# Patient Record
Sex: Female | Born: 1955 | Race: White | Hispanic: No | Marital: Single | Smoking: Never smoker
Health system: Southern US, Community
[De-identification: ages and names within clinical notes are randomized; demographics above are authoritative.]

## PROBLEM LIST (undated history)

## (undated) DIAGNOSIS — Z9889 Other specified postprocedural states: Secondary | ICD-10-CM

## (undated) DIAGNOSIS — E785 Hyperlipidemia, unspecified: Secondary | ICD-10-CM

## (undated) DIAGNOSIS — K219 Gastro-esophageal reflux disease without esophagitis: Secondary | ICD-10-CM

## (undated) DIAGNOSIS — R112 Nausea with vomiting, unspecified: Secondary | ICD-10-CM

## (undated) DIAGNOSIS — M1990 Unspecified osteoarthritis, unspecified site: Secondary | ICD-10-CM

## (undated) HISTORY — PX: OTHER SURGICAL HISTORY: SHX169

## (undated) HISTORY — DX: Gastro-esophageal reflux disease without esophagitis: K21.9

## (undated) HISTORY — DX: Hyperlipidemia, unspecified: E78.5

## (undated) HISTORY — PX: COLONOSCOPY: SHX174

## (undated) HISTORY — PX: KNEE SURGERY: SHX244

---

## 2016-05-16 ENCOUNTER — Encounter: Payer: Self-pay | Admitting: Emergency Medicine

## 2016-05-16 ENCOUNTER — Emergency Department
Admission: EM | Admit: 2016-05-16 | Discharge: 2016-05-16 | Disposition: A | Discharge status: Home / Self Care | Payer: BLUE CROSS/BLUE SHIELD | Attending: Emergency Medicine | Admitting: Emergency Medicine

## 2016-05-16 ENCOUNTER — Emergency Department: Payer: BLUE CROSS/BLUE SHIELD

## 2016-05-16 DIAGNOSIS — R079 Chest pain, unspecified: Secondary | ICD-10-CM

## 2016-05-16 DIAGNOSIS — R0789 Other chest pain: Secondary | ICD-10-CM | POA: Insufficient documentation

## 2016-05-16 LAB — BASIC METABOLIC PANEL
ANION GAP: 7 (ref 5–15)
BUN: 10 mg/dL (ref 6–20)
CALCIUM: 9.5 mg/dL (ref 8.9–10.3)
CO2: 25 mmol/L (ref 22–32)
Chloride: 108 mmol/L (ref 101–111)
Creatinine, Ser: 0.8 mg/dL (ref 0.44–1.00)
GFR calc Af Amer: >60 mL/min (ref >60)
Glucose, Bld: 100 mg/dL — ABNORMAL HIGH (ref 65–99)
POTASSIUM: 3.9 mmol/L (ref 3.5–5.1)
SODIUM: 140 mmol/L (ref 135–145)

## 2016-05-16 LAB — CBC
HEMATOCRIT: 39.3 % (ref 35.0–47.0)
Hemoglobin: 13.2 g/dL (ref 12.0–16.0)
MCH: 28.5 pg (ref 26.0–34.0)
MCHC: 33.5 g/dL (ref 32.0–36.0)
MCV: 85.1 fL (ref 80.0–100.0)
Platelets: 210 10*3/uL (ref 150–440)
RBC: 4.62 MIL/uL (ref 3.80–5.20)
RDW: 13.4 % (ref 11.5–14.5)
WBC: 5.6 10*3/uL (ref 3.6–11.0)

## 2016-05-16 LAB — TROPONIN I

## 2016-05-16 MED ORDER — MECLIZINE HCL 25 MG PO TABS
25.0000 mg | ORAL_TABLET | Freq: Once | ORAL | Status: AC
  Administered 2016-05-16: 25 mg via ORAL
  Filled 2016-05-16: qty 1

## 2016-05-16 MED ORDER — KETOROLAC TROMETHAMINE 30 MG/ML IJ SOLN
15.0000 mg | Freq: Once | INTRAMUSCULAR | Status: AC
  Administered 2016-05-16: 15 mg via INTRAVENOUS
  Filled 2016-05-16: qty 1

## 2016-05-16 NOTE — Discharge Instructions (Signed)
Return to the ER for new or worsening pain, difficulty breathing, fever, or for any other concerns. The sure to follow up with a cardiologist for a heart stress test.  Chest Wall Pain Chest wall pain is pain in or around the bones and muscles of your chest. Sometimes, an injury causes this pain. Sometimes, the cause may not be known. This pain may take several weeks or longer to get better. HOME CARE INSTRUCTIONS  Pay attention to any changes in your symptoms. Take these actions to help with your pain:   Rest as told by your health care provider.   Avoid activities that cause pain. These include any activities that use your chest muscles or your abdominal and side muscles to lift heavy items.   If directed, apply ice to the painful area:  Put ice in a plastic bag.  Place a towel between your skin and the bag.  Leave the ice on for 20 minutes, 2-3 times per day.  Take over-the-counter and prescription medicines only as told by your health care provider.  Do not use tobacco products, including cigarettes, chewing tobacco, and e-cigarettes. If you need help quitting, ask your health care provider.  Keep all follow-up visits as told by your health care provider. This is important. SEEK MEDICAL CARE IF:  You have a fever.  Your chest pain becomes worse.  You have new symptoms. SEEK IMMEDIATE MEDICAL CARE IF:  You have nausea or vomiting.  You feel sweaty or light-headed.  You have a cough with phlegm (sputum) or you cough up blood.  You develop shortness of breath.   This information is not intended to replace advice given to you by your health care provider. Make sure you discuss any questions you have with your health care provider.   Document Released: 12/09/2005 Document Revised: 08/30/2015 Document Reviewed: 03/06/2015 Elsevier Interactive Patient Education [employer]2016 [employer]

## 2016-05-16 NOTE — ED Notes (Signed)
Pt presents to ED with new onset chest pain since last night. Pt describes pain as an aching pressure. Pt states pain is central chest and back. Pt denies radiation to neck or arm. Pt speaking in complete sentences, denies shortness of breath. Pt reports mild nausea and vomiting.

## 2016-05-16 NOTE — ED Notes (Signed)

## 2016-05-16 NOTE — ED Provider Notes (Signed)
[facility] Emergency Department Provider Note ____________________________________________  Time seen: Approximately 7:16 PM  I have reviewed the triage vital signs and the nursing notes.   HISTORY  Chief Complaint Chest Pain   HPI Samantha Gray is a 60 y.o. female with no significant past medical history aside from arthritis and allergies who presents with central substernal chest pain that began around 11:30 PM last night. It feels like it is "pulling behind by sternum" and radiates to the bilateral shoulder blades. She normally has difficulty sleeping and did not fall asleep until 2 AM. 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 When she was driving home from work the pain intensified around 2:58 PM and got to around an 8 out of 10. It is now around a 4 out of 10. She denies any recent fevers or URI symptoms. When the pain intensifies she was rubbing her sternum and was concerned that there is a lump that her sternal base. She did not have shortness of breath associated with the symptoms that the pain was so intense that it made it difficult to breathe. She has never smoked but her husband does.    She does recall having an episode of chest pain around 10-12 years ago for which she was treated in the [facility] ER and was thought to have some type of inflammation which she does not recall the name of.  She has also had several episodes of vertigo today that she describes as room spinning and associated with nausea. She has been getting vertigo intermittently for years and has been seen by ENT and chiropractors without relief of symptoms. She has not tried meclizine in many years.  History reviewed. No pertinent past medical history.  There are no active problems to display for this patient.   Past Surgical History  Procedure Laterality Date  . Cesarean section    . Knee surgery      No current outpatient  prescriptions on file.  Allergies Codeine and Prednisone  No family history on file.  Social History Social History  Substance Use Topics  . Smoking status: Never Smoker   . Smokeless tobacco: None  . Alcohol Use: No    Review of Systems Constitutional: No fever/chills Eyes: No visual changes. ENT: No sore throat. Cardiovascular:See history of present illness Respiratory: See history of present illness Gastrointestinal: No abdominal pain.   Musculoskeletal: Chronic arthritis bilateral hands for which she takes around 1600 mg of ibuprofen daily Skin: Negative for rash. Neurological: Vertigo per history of present illness  10-point ROS otherwise negative.  ____________________________________________   PHYSICAL EXAM:  VITAL SIGNS: ED Triage Vitals  Enc Vitals Group     BP 05/16/16 1800 128/77 mmHg     Pulse Rate 05/16/16 1800 69     Resp 05/16/16 1800 18     Temp 05/16/16 1800 97.9 F (36.6 C)     Temp Source 05/16/16 1800 Oral     SpO2 05/16/16 1800 100 %     Weight 05/16/16 1800 165 lb (74.844 kg)     Height 05/16/16 1800 5\' 5"  (1.651 m)     Head Cir --      Peak Flow --      Pain Score 05/16/16 1800 8     Pain Loc --      Pain Edu? --      Excl. in GC? --    Constitutional: Alert  and oriented. Well appearing and in no acute distress. Eyes: Conjunctivae are normal. PERRL. EOMI. Head: Atraumatic. Nose: No congestion/rhinnorhea. Mouth/Throat: Mucous membranes are moist.  Oropharynx non-erythematous. Neck: No stridor.   Cardiovascular: Normal rate, regular rhythm. Grossly normal heart sounds.  Good peripheral circulation. Sternum is normal to palpation; no deformity or mass palpated Respiratory: Normal respiratory effort.  No retractions. Lungs CTAB. Gastrointestinal: Soft and nontender. No distention. No abdominal bruits. No CVA tenderness. Musculoskeletal: No lower extremity tenderness nor edema.   Neurologic:  Normal speech and language. No gross focal  neurologic deficits are appreciated.  Skin:  Skin is warm, dry and intact. No rash noted. Psychiatric: Mood and affect are normal. Speech and behavior are normal.  ____________________________________________   LABS (all labs ordered are listed, but only abnormal results are displayed)  Labs Reviewed  BASIC METABOLIC PANEL - Abnormal; Notable for the following:    Glucose, Bld 100 (*)    All other components within normal limits  CBC  TROPONIN I  TROPONIN I   ____________________________________________  EKG  Date: 05/16/2016  Rate: 68  Rhythm: normal sinus rhythm  QRS Axis: normal  Intervals: normal x/lafb  ST/T Wave abnormalities: normal  Conduction Disutrbances: none  Narrative Interpretation: unremarkable     ____________________________________________  RADIOLOGY  cxr-IMPRESSION: Potential inflammatory changes in the right middle lobe and/or lingula without consolidation.   Electronically Signed By: Carey Bullocks M.D. On: 05/16/2016 18:38 _____________________________________________   INITIAL IMPRESSION / ASSESSMENT AND PLAN / ED COURSE  Pertinent labs & imaging results that were available during my care of the patient were reviewed by me and considered in my medical decision making (see chart for details).  ----------------------------------------- 11:25 PM on 05/16/2016 -----------------------------------------  Pain has significantly improved after Toradol. Patient to follow up with cardiologist for heart stress test. She understands that artery blockage has not been ruled out although I suspect this is consistent with chest wall pain. Heart rate is in the mid 40s which patient states is normal for her. ____________________________________________   FINAL CLINICAL IMPRESSION(S) / ED DIAGNOSES  Final diagnoses:  Chest pain, unspecified chest pain type      New prescriptions started this visit New Prescriptions   No medications on  file     Maurilio Lovely, MD 05/16/16 2326

## 2016-05-21 ENCOUNTER — Telehealth: Payer: Self-pay | Admitting: Cardiovascular Disease

## 2016-05-21 NOTE — Telephone Encounter (Signed)
Lmov for patient to call back and make ED fu appointment. Was seen on 05/16/16 for CP  Please call.

## 2016-06-06 ENCOUNTER — Ambulatory Visit (INDEPENDENT_AMBULATORY_CARE_PROVIDER_SITE_OTHER): Payer: BLUE CROSS/BLUE SHIELD | Admitting: Cardiology

## 2016-06-06 ENCOUNTER — Encounter: Payer: Self-pay | Admitting: *Deleted

## 2016-06-06 ENCOUNTER — Encounter: Payer: Self-pay | Admitting: Cardiology

## 2016-06-06 VITALS — BP 118/76 | HR 77 | Ht 65.0 in | Wt 169.0 lb

## 2016-06-06 DIAGNOSIS — I444 Left anterior fascicular block: Secondary | ICD-10-CM | POA: Diagnosis not present

## 2016-06-06 DIAGNOSIS — R9431 Abnormal electrocardiogram [ECG] [EKG]: Secondary | ICD-10-CM

## 2016-06-06 DIAGNOSIS — R079 Chest pain, unspecified: Secondary | ICD-10-CM | POA: Diagnosis not present

## 2016-06-06 MED ORDER — ASPIRIN EC 81 MG PO TBEC: 81.0000 mg | DELAYED_RELEASE_TABLET | Freq: Every day | ORAL | Status: DC

## 2016-06-06 MED ORDER — NITROGLYCERIN 0.4 MG SL SUBL: 0.4000 mg | SUBLINGUAL_TABLET | SUBLINGUAL | Status: DC | PRN

## 2016-06-06 NOTE — Patient Instructions (Addendum)
Medication Instructions:  Your physician has recommended you make the following change in your medication:  1. Start Aspirin 81 mg Once daily 2. Nitroglycerin 0.4 mg under the tongue for chest pain as needed. 1 tablet under the tongue every 5 minutes as needed up to 3 doses. Please read information below before taking first dose to know possible side effects.    Labwork: Labs done today were Lipid panel and liver function testing.  Testing/Procedures: Your physician has requested that you have an echocardiogram. Echocardiography is a painless test that uses sound waves to create images of your heart. It provides your doctor with information about the size and shape of your heart and how well your heart's chambers and valves are working. This procedure takes approximately one hour. There are no restrictions for this procedure.  Date & Time: _____________________________________________________  [facility]  Your caregiver has ordered a Stress Test with nuclear imaging. The purpose of this test is to evaluate the blood supply to your heart muscle. Cardiac stress tests are done to find areas of poor blood flow to the heart by determining the extent of coronary artery disease (CAD).    Please note: these test may take anywhere between 2-4 hours to complete  PLEASE REPORT TO [facility]ICAL MALL ENTRANCE  THE VOLUNTEERS AT THE FIRST DESK WILL DIRECT YOU WHERE TO GO  Date of Procedure:_Friday June 14, 2016 at 08:30AM__  Arrival Time for Procedure:__Arrive at 08:15AM to register___     PLEASE NOTIFY THE OFFICE AT LEAST 24 HOURS IN ADVANCE IF YOU ARE UNABLE TO KEEP YOUR APPOINTMENT.  [phone] AND  PLEASE NOTIFY NUCLEAR MEDICINE AT [facility] AT LEAST 24 HOURS IN ADVANCE IF YOU ARE UNABLE TO KEEP YOUR APPOINTMENT. [phone]  How to prepare for your Myoview test:   Do not eat or drink after midnight  No caffeine for 24 hours prior to test  No smoking 24 hours prior to test.  Your  medication may be taken with water.  If your doctor stopped a medication because of this test, do not take that medication.  Ladies, please do not wear dresses.  Skirts or pants are appropriate. Please wear a short sleeve shirt.  No perfume, cologne or lotion.  Wear comfortable walking shoes. No heels!   Follow-Up: Your physician recommends that you schedule a follow-up appointment after testing to review results.  Date & Time: ____________________________________________________    It was a pleasure seeing you today here in the office. Please do not hesitate to give [place] a call back if you have any further questions. [phone]        Lipid Profile Test WHY AM I HAVING THIS TEST? The lipid profile test gives results that can help predict the likelihood of developing heart disease. The test is also used to monitor treatment for high cholesterol to see if you are reaching your goals. A lipid profile measures the following:  Total cholesterol. Cholesterol is a waxy fat in your blood. If your total cholesterol is elevated, this can increase your risk of coronary heart disease.  High-density lipoprotein (HDL). This is known as the good cholesterol. Having a high level of HDL is good. Your HDL level may be low if you smoke or do not get enough exercise.  Low-density lipoprotein (LDL). This is known as the bad cholesterol and is responsible for the formation of plaque in the arteries. Having a low level of LDL is best.  Cholesterol to HDL ratio. This is calculated by dividing the  total cholesterol by the HDL cholesterol. The ratio is used by health care providers for determining your risk of heart disease. A low ratio is best.  Triglycerides. These are a type of fat in the blood responsible for providing energy to your cells. Low levels are best. WHAT KIND OF SAMPLE IS TAKEN? A blood sample is required for this test. It is usually collected by inserting a needle into a vein. HOW DO I  PREPARE FOR THE TEST?  Do not eat or drink anything after midnight on the night before the test or as directed by your health care provider. WHAT ARE THE REFERENCE RANGES? Reference ranges are considered healthy ranges established after testing a large group of healthy people. Reference ranges may vary among different people, labs, and hospitals. It is your responsibility to obtain your test results. Ask the lab or department performing the test when and how you will get your results. Reference ranges for the lipid profile test are as follows: Total Cholesterol  Adult or elderly: less than 200 mg/dL or less than 1.61 mmol/L (SI units).  Child: 120-200 mg/dL.  Infant: 70-175 mg/dL.  Newborns: 53-135 mg/dL. HDL  Female: greater than 45 mg/dL or greater than 0.96 mmol/L (SI units).  Female: greater than 55 mg/dL or greater than 0.45 mmol/L (SI units). HDL reference values based on risk of heart disease:  For low risk of heart disease:  Female: 60 mg/dL or 4.09 mmol/L.  Female: 70 mg/dL or 8.11 mmol/L.  For moderate risk of heart disease:  Female: 45 mg/dL or 9.14 mmol/L.  Female: 55 mg/dL or 7.82 mmol/L.  For high risk of heart disease:  Female: 25 mg/dL or 9.56 mmol/L.  Female: 35 mg/dL or 2.13 mmol/L. LDL  Adult: less than 130 mg/dL.  Children: less than 110 mg/dL. Cholesterol to HDL Ratio Reference values based on risk for coronary heart disease:  Risk that is one half average:  Female: 3.4.  Female: 3.3.  Average risk:  Female: 5.0.  Female: 4.4.  Risk that is two times average (moderate risk):  Female: 10.0.  Female: 7.0.  Risk that is three times average (high risk):  Female: 24.0.  Female: 11.0. Triglycerides  Adult or elderly:  Female: 40-160 mg/dL or 0.86-5.78 mmol/L (SI units).  Female: 35-135 mg/dL or 4.69-6.29 mmol/L (SI units).  Children 69-58 years old:  Female: 30-86 mg/dL.  Female: 32-99 mg/dL.  Children 42-22 years old:  Female: 31-108  mg/dL.  Female: 35-114 mg/dL.  Children 65-74 years old:  Female: 36-138 mg/dL.  Female: 41-138 mg/dL.  Children 92-23 years old:  Female: 40-163 mg/dL.  Female: 40-128 mg/dL. Triglycerides should be less than 400 mg/dL even when you are not fasting.  WHAT DO THE RESULTS MEAN?  Talk with your health care provider to discuss your results, treatment options, and if necessary, the need for more tests. Talk with your health care provider if you have any questions about your results.   This information is not intended to replace advice given to you by your health care provider. Make sure you discuss any questions you have with your health care provider.   Document Released: 01/02/2005 Document Revised: 12/30/2014 Document Reviewed: 03/31/2014 Elsevier Interactive Patient Education 2016 Elsevier Inc.     Liver Function Tests Liver function tests are blood tests to see how well your liver is working. The proteins and enzymes measured in the test can alert your health care provider to inflammation, damage, or disease in your liver. It is common  to have liver function tests:  During annual physical exams.  When you are taking certain medicines.  If you have liver disease.  If you drink a lot of alcohol.  When you are not feeling well.  When you have other conditions that may affect the liver. Substances measured may include:   Alanine transaminase (ALT). This is an enzyme in the liver.  Aspartate transaminase (AST). This is an enzyme in the liver, heart, and muscles.  Alkaline phosphatase (ALP). This is a protein in the liver, bile ducts, and bone. It is also in other body tissues.  Total bilirubin. This is a yellow pigment in bile.  Albumin. This is a protein in the liver.  Prothrombin time and international normalized ratio (PT and INR). PT measures the time that it takes for your blood to clot. INR is a calculation of blood clotting time based upon your PT result. It is  also calculated based on normal ranges defined by the laboratory that processed your lab test.  Total protein. This measures two proteins, albumin and globulin, found in the blood. PREPARATION FOR TEST How you prepare will depend on which tests are being done and the reason why these tests are being done. You may need to:  Avoid eating for 4-6 hours before the test or as directed by your health care provider.  Stop taking certain medicines prior to your blood test as directed by your health care provider. RESULTS  It is your responsibility to obtain your test results. Ask the lab or department performing the test when and how you will get your results. Contact your health care provider to discuss any questions you have about your results. RANGE OF NORMAL VALUES Ranges for normal values may vary among different labs and hospitals. You should always check with your health care provider after having lab work or other tests done to discuss the meaning of your test results and whether your values are considered within normal limits. The following are normal ranges for substances measured in liver function tests: ALT  Infant: may be twice as high as adult values.  Child or adult: 4-36 international units/L at 37C or 4-36 units/L (SI units).  Elderly: may be slightly higher than adult values. AST  Newborn 25-27 days old: 35-140 units/L.  Child under 85 years old: 15-60 units/L.  38-85 years old: 15-50 units/L.  73-56 years old: 10-50 units/L.  71-67 years old: 10-40 units/L.  Adult: 0-35 units/L or 0-0.58 microkatal/L (SI units).  Elderly: slightly higher than adults. ALP  Child under 90 years old: 85-235 units/L.  23-29 years old: 65-210 units/L.  71-21 years old: 60-300 units/L.  44-35 years old: 30-200 units/L.  Adult: 30-120 units/L or 0.5-2.0 microkatal/L (SI units).  Elderly: slightly higher than adult. Total bilirubin  Newborn: 1.0-12.0 mg/dL or 16.1-096 micromoles/L (SI  units).  Adult, elderly, or child: 0.3-1.0 mg/dL or 0.4-54 micromoles/L. Albumin  Premature infant: 3.0-4.2 g/dL.  Newborn: 3.5-5.4 g/dL.  Infant: 4.4-5.4 g/dL.  Child: 4.0-5.9 g/dL.  Adult or elderly: 3.5-5.0 g/dL or 09-81 g/L (SI units). PT  11.0-12.5 seconds; 85%-100%. INR  0.8-1.1. Total protein  Premature infant: 4.2-7.6 g/dL.  Newborn: 4.6-7.4 g/dL.  Infant: 6.0-6.7 g/dL.  Child: 6.2-8.0 g/dL.  Adult or elderly: 6.4-8.3 g/dL or 19-14 g/L (SI units). MEANING OF RESULTS OUTSIDE NORMAL VALUE RANGES Sometimes test results can be abnormal due to other factors, such as medicines, exercise, or pregnancy. Follow up with your health care provider if you have any questions about  test results outside the normal value ranges. ALT  Levels above the normal range, along with other test results, may indicate liver disease. AST  Levels above the normal range, along with other test results, may indicate liver disease. Sometimes levels also increase after burns, surgery, heart attack, muscle damage, or seizure. ALP  Levels above the normal range, along with other test results, may indicate biliary obstruction, diseases of the liver, bone disease, thyroid disease, tumors, fractures, leukemia or lymphoma, or several other conditions. People with blood type O or B may show higher levels after a fatty meal.  Levels below the normal range, along with other test results, may indicate bone and teeth conditions, malnutrition, protein deficiency, or Wilson disease. Total bilirubin  Levels above the normal range, along with other test results, may indicate problems with the liver, gallbladder, or bile ducts. Albumin  Levels above the normal range, along with other test results, may indicate dehydration. They may also be caused by a diet that is high in protein. Sometimes, the band placed around the upper arm during the process of drawing blood can cause the level of this protein in your  blood to rise and give you a result above the normal range.  Levels below the normal range, along with other tests results, may indicate kidney disease, liver disease, or malabsorption of nutrients. PT and INR  Levels above the normal range mean your blood is clotting slower than normal. This may be due to blood disorders, liver disorders, or low levels of vitamin K. Total protein  Levels above the normal range, along with other test results, may be due to infection or other diseases.  Levels below the normal range, along with other test results, may be due to an immune system disorder, bleeding, burns, kidney disorder, liver disease, trouble absorbing or getting enough nutrients, or other conditions that affect the intestines.   This information is not intended to replace advice given to you by your health care provider. Make sure you discuss any questions you have with your health care provider.   Document Released: 01/11/2005 Document Revised: 12/30/2014 Document Reviewed: 04/14/2014 Elsevier Interactive Patient Education 2016 [employer].     Echocardiogram An echocardiogram, or echocardiography, uses sound waves (ultrasound) to produce an image of your heart. The echocardiogram is simple, painless, obtained within a short period of time, and offers valuable information to your health care provider. The images from an echocardiogram can provide information such as:  Evidence of coronary artery disease (CAD).  Heart size.  Heart muscle function.  Heart valve function.  Aneurysm detection.  Evidence of a past heart attack.  Fluid buildup around the heart.  Heart muscle thickening.  Assess heart valve function. LET [facility] CARE PROVIDER KNOW ABOUT:  Any allergies you have.  All medicines you are taking, including vitamins, herbs, eye drops, creams, and over-the-counter medicines.  Previous problems you or members of your family have had with the use of  anesthetics.  Any blood disorders you have.  Previous surgeries you have had.  Medical conditions you have.  Possibility of pregnancy, if this applies. BEFORE THE PROCEDURE  No special preparation is needed. Eat and drink normally.  PROCEDURE   In order to produce an image of your heart, gel will be applied to your chest and a wand-like tool (transducer) will be moved over your chest. The gel will help transmit the sound waves from the transducer. The sound waves will harmlessly bounce off your heart to allow the  heart images to be captured in real-time motion. These images will then be recorded.  You may need an IV to receive a medicine that improves the quality of the pictures. AFTER THE PROCEDURE You may return to your normal schedule including diet, activities, and medicines, unless your health care provider tells you otherwise.   This information is not intended to replace advice given to you by your health care provider. Make sure you discuss any questions you have with your health care provider.   Document Released: 12/06/2000 Document Revised: 12/30/2014 Document Reviewed: 08/16/2013 Elsevier Interactive Patient Education 2016 Elsevier Inc.    Cardiac Nuclear Scanning A cardiac nuclear scan is used to check your heart for problems, such as the following:  A portion of the heart is not getting enough blood.  Part of the heart muscle has died, which happens with a heart attack.  The heart wall is not working normally.  In this test, a radioactive dye (tracer) is injected into your bloodstream. After the tracer has traveled to your heart, a scanning device is used to measure how much of the tracer is absorbed by or distributed to various areas of your heart. LET [facility] CARE PROVIDER KNOW ABOUT:  Any allergies you have.  All medicines you are taking, including vitamins, herbs, eye drops, creams, and over-the-counter medicines.  Previous problems you or members of  your family have had with the use of anesthetics.  Any blood disorders you have.  Previous surgeries you have had.  Medical conditions you have.  RISKS AND COMPLICATIONS Generally, this is a safe procedure. However, as with any procedure, problems can occur. Possible problems include:   Serious chest pain.  Rapid heartbeat.  Sensation of warmth in your chest. This usually passes quickly. BEFORE THE PROCEDURE Ask your health care provider about changing or stopping your regular medicines. PROCEDURE This procedure is usually done at a hospital and takes 2-4 hours.  An IV tube is inserted into one of your veins.  Your health care provider will inject a small amount of radioactive tracer through the tube.  You will then wait for 20-40 minutes while the tracer travels through your bloodstream.  You will lie down on an exam table so images of your heart can be taken. Images will be taken for about 15-20 minutes.  You will exercise on a treadmill or stationary bike. While you exercise, your heart activity will be monitored with an electrocardiogram (ECG), and your blood pressure will be checked.  If you are unable to exercise, you may be given a medicine to make your heart beat faster.  When blood flow to your heart has peaked, tracer will again be injected through the IV tube.  After 20-40 minutes, you will get back on the exam table and have more images taken of your heart.  When the procedure is over, your IV tube will be removed. AFTER THE PROCEDURE  You will likely be able to leave shortly after the test. Unless your health care provider tells you otherwise, you may return to your normal schedule, including diet, activities, and medicines.  Make sure you find out how and when you will get your test results.   This information is not intended to replace advice given to you by your health care provider. Make sure you discuss any questions you have with your health care  provider.   Document Released: 01/03/2005 Document Revised: 12/14/2013 Document Reviewed: 11/17/2013 Elsevier Interactive Patient Education [employer]  Nitroglycerin sublingual tablets What is this medicine? NITROGLYCERIN (nye troe GLI ser in) is a type of vasodilator. It relaxes blood vessels, increasing the blood and oxygen supply to your heart. This medicine is used to relieve chest pain caused by angina. It is also used to prevent chest pain before activities like climbing stairs, going outdoors in cold weather, or sexual activity. This medicine may be used for other purposes; ask your health care provider or pharmacist if you have questions. What should I tell my health care provider before I take this medicine? They need to know if you have any of these conditions: -anemia -head injury, recent stroke, or bleeding in the brain -liver disease -previous heart attack -an unusual or allergic reaction to nitroglycerin, other medicines, foods, dyes, or preservatives -pregnant or trying to get pregnant -breast-feeding How should I use this medicine? Take this medicine by mouth as needed. At the first sign of an angina attack (chest pain or tightness) place one tablet under your tongue. You can also take this medicine 5 to 10 minutes before an event likely to produce chest pain. Follow the directions on the prescription label. Let the tablet dissolve under the tongue. Do not swallow whole. Replace the dose if you accidentally swallow it. It will help if your mouth is not dry. Saliva around the tablet will help it to dissolve more quickly. Do not eat or drink, smoke or chew tobacco while a tablet is dissolving. If you are not better within 5 minutes after taking ONE dose of nitroglycerin, call 9-1-1 immediately to seek emergency medical care. Do not take more than 3 nitroglycerin tablets over 15 minutes. If you take this medicine often to relieve symptoms of angina, your doctor or  health care professional may provide you with different instructions to manage your symptoms. If symptoms do not go away after following these instructions, it is important to call 9-1-1 immediately. Do not take more than 3 nitroglycerin tablets over 15 minutes. Talk to your pediatrician regarding the use of this medicine in children. Special care may be needed. Overdosage: If you think you have taken too much of this medicine contact a poison control center or emergency room at once. NOTE: This medicine is only for you. Do not share this medicine with others. What if I miss a dose? This does not apply. This medicine is only used as needed. What may interact with this medicine? Do not take this medicine with any of the following medications: -certain migraine medicines like ergotamine and dihydroergotamine (DHE) -medicines used to treat erectile dysfunction like sildenafil, tadalafil, and vardenafil -riociguat This medicine may also interact with the following medications: -alteplase -aspirin -heparin -medicines for high blood pressure -medicines for mental depression -other medicines used to treat angina -phenothiazines like chlorpromazine, mesoridazine, prochlorperazine, thioridazine This list may not describe all possible interactions. Give your health care provider a list of all the medicines, herbs, non-prescription drugs, or dietary supplements you use. Also tell them if you smoke, drink alcohol, or use illegal drugs. Some items may interact with your medicine. What should I watch for while using this medicine? Tell your doctor or health care professional if you feel your medicine is no longer working. Keep this medicine with you at all times. Sit or lie down when you take your medicine to prevent falling if you feel dizzy or faint after using it. Try to remain calm. This will help you to feel better faster. If you feel dizzy, take several deep breaths and  lie down with your feet propped  up, or bend forward with your head resting between your knees. You may get drowsy or dizzy. Do not drive, use machinery, or do anything that needs mental alertness until you know how this drug affects you. Do not stand or sit up quickly, especially if you are an older patient. This reduces the risk of dizzy or fainting spells. Alcohol can make you more drowsy and dizzy. Avoid alcoholic drinks. Do not treat yourself for coughs, colds, or pain while you are taking this medicine without asking your doctor or health care professional for advice. Some ingredients may increase your blood pressure. What side effects may I notice from receiving this medicine? Side effects that you should report to your doctor or health care professional as soon as possible: -blurred vision -dry mouth -skin rash -sweating -the feeling of extreme pressure in the head -unusually weak or tired Side effects that usually do not require medical attention (report to your doctor or health care professional if they continue or are bothersome): -flushing of the face or neck -headache -irregular heartbeat, palpitations -nausea, vomiting This list may not describe all possible side effects. Call your doctor for medical advice about side effects. You may report side effects to FDA at 1-800-FDA-1088. Where should I keep my medicine? Keep out of the reach of children. Store at room temperature between 20 and 25 degrees C (68 and 77 degrees F). Store in Retail buyer. Protect from light and moisture. Keep tightly closed. Throw away any unused medicine after the expiration date. NOTE: This sheet is a summary. It may not cover all possible information. If you have questions about this medicine, talk to your doctor, pharmacist, or health care provider.    2016, Elsevier/Gold Standard. (2013-10-07 17:57:36)

## 2016-06-06 NOTE — Progress Notes (Signed)
Cardiology Office Note   Date:  06/06/2016   ID:  Samantha DunDiana Bastyr, DOB 04/20/1956, MRN [id number]  Referring Doctor:  Franciso BendSHIPLEY, MICHAEL B, MD   Cardiologist:   Almond LintAileen Earline Stiner, MD   Reason for consultation:  Chief Complaint  Patient presents with  . other    [facility]; chest pain. Meds reviewed by the patient verbally. Pt. c/o chest tightness.       History of Present Illness: Samantha Gray is a 60 y.o. female who presents for Chest pain. She describes it as a tightness in her chest, 6-7 out of 10 in severity, lasting minutes at a time, nonradiating, brought on by stress and also physical activity. This has been going on for the last 3 weeks. Because of the persistence and history of heart disease in the family, patient became very concerned and wanted it checked out. She ended up in the ED with this and was advised to seek cardiology after being ruled out.  Patient recalls being on atorvastatin for high levels of her cholesterol. She thinks that she may have been on the highest dose, 80 mg, because her cholesterol numbers were really high. However she developed significant muscle cramps and have not been on anything for her cholesterol.  Patient does not really complain of shortness of breath. No palpitations. No fever, cough, colds, abdominal pain. No PND, orthopnea, edema   ROS:  Please see the history of present illness. Aside from mentioned under HPI, all other systems are reviewed and negative.     Past Medical History  Diagnosis Date  . Hyperlipidemia   . GERD (gastroesophageal reflux disease)     Past Surgical History  Procedure Laterality Date  . Cesarean section    . Knee surgery       reports that she has never smoked. She does not have any smokeless tobacco history on file. She reports that she does not drink alcohol or use illicit drugs.   family history includes Heart attack in her brother; Heart attack (age of onset: 1358) in her father; Heart  attack (age of onset: 6159) in her brother; Heart disease in her brother; Hyperlipidemia in her brother and brother; Hypertension in her brother and brother. A brother developed heart disease at age 60, brother smokes and drinks heavily  Current Outpatient Prescriptions  Medication Sig Dispense Refill  . fluticasone (FLONASE) 50 MCG/ACT nasal spray Place 2 sprays into the nose daily.     Marland Kitchen. ibuprofen (GOODSENSE IBUPROFEN) 200 MG tablet Take 200 mg by mouth every 6 (six) hours as needed.     . loratadine (CLARITIN) 10 MG tablet Take 10 mg by mouth daily.     . Multiple Vitamin (MULTIVITAMIN) capsule Take 1 capsule by mouth daily.     . valACYclovir (VALTREX) 500 MG tablet Take 500 mg by mouth daily as needed.     Marland Kitchen. aspirin EC 81 MG tablet Take 1 tablet (81 mg total) by mouth daily. 90 tablet 3  . nitroGLYCERIN (NITROSTAT) 0.4 MG SL tablet Place 1 tablet (0.4 mg total) under the tongue every 5 (five) minutes as needed for chest pain. 25 tablet 6   No current facility-administered medications for this visit.    Allergies: Codeine and Prednisone    PHYSICAL EXAM: VS:  BP 118/76 mmHg  Pulse 77  Ht 5\' 5"  (1.651 m)  Wt 169 lb (76.658 kg)  BMI 28.12 kg/m2 , Body mass index is 28.12 kg/(m^2). Wt Readings from Last 3  Encounters:  06/06/16 169 lb (76.658 kg)  05/16/16 165 lb (74.844 kg)    GENERAL:  well developed, well nourished, not in acute distress HEENT: normocephalic, pink conjunctivae, anicteric sclerae, no xanthelasma, normal dentition, oropharynx clear NECK:  no neck vein engorgement, JVP normal, no hepatojugular reflux, carotid upstroke brisk and symmetric, no bruit, no thyromegaly, no lymphadenopathy LUNGS:  good respiratory effort, clear to auscultation bilaterally CV:  PMI not displaced, no thrills, no lifts, S1 and S2 within normal limits, no palpable S3 or S4, no murmurs, no rubs, no gallops ABD:  Soft, nontender, nondistended, normoactive bowel sounds, no abdominal aortic bruit, no  hepatomegaly, no splenomegaly MS: nontender back, no kyphosis, no scoliosis, no joint deformities EXT:  2+ DP/PT pulses, no edema, no varicosities, no cyanosis, no clubbing SKIN: warm, nondiaphoretic, normal turgor, no ulcers NEUROPSYCH: alert, oriented to person, place, and time, sensory/motor grossly intact, normal mood, appropriate affect  Recent Labs: 05/16/2016: BUN 10; Creatinine, Ser 0.80; Hemoglobin 13.2; Platelets 210; Potassium 3.9; Sodium 140   Lipid Panel No results found for: CHOL, TRIG, HDL, CHOLHDL, VLDL, LDLCALC, LDLDIRECT   Other studies Reviewed:  EKG:  The ekg from 06/06/2016 was personally reviewed by me and it revealed sinus rhythm, 77 BPM. LAFB. Abnormal EKG.  Additional studies/ records that were reviewed personally reviewed by me today include: None available   ASSESSMENT AND PLAN: Chest tightness or chest pain LAFB/abnormal EKG Risk factors for CAD include hyperlipidemia, and significant family history of premature CAD. Recommend further evaluation with nuclear exercise stress test. Recommend echocardiogram Patient prescribed ASA, and NTG SL prn for chest pain. Patient instructed to call [phone] for unrelenting chest pain. Patient will be provided with a work note stating that we are recommending the patient avoid strenuous physical activity while undergoing cardiac workup.  Hyperlipidemia Check lipid panel and LFTs today. Then we will determine which statin medication to try. Likely goal is LDL of less than 70 due to history of premature CAD in the family.    Current medicines are reviewed at length with the patient today.  The patient does not have concerns regarding medicines.  Labs/ tests ordered today include:  Orders Placed This Encounter  Procedures  . NM Myocar Multi W/Spect W/Wall Motion / EF  . Lipid panel  . Hepatic function panel  . EKG 12-Lead  . ECHOCARDIOGRAM COMPLETE    I had a lengthy and detailed discussion with the patient  regarding diagnoses, prognosis, diagnostic options, treatment options , and side effects of medications.   I counseled the patient on importance of lifestyle modification including heart healthy diet, regular physical activity, once cardiac workup is done.   Disposition:   FU with undersigned after tests    Signed, Almond Lint, MD  06/06/2016 11:31 AM    [facility]

## 2016-06-07 ENCOUNTER — Telehealth: Payer: Self-pay | Admitting: *Deleted

## 2016-06-07 DIAGNOSIS — E785 Hyperlipidemia, unspecified: Secondary | ICD-10-CM

## 2016-06-07 LAB — HEPATIC FUNCTION PANEL
ALBUMIN: 4.3 g/dL (ref 3.5–5.5)
ALT: 11 IU/L (ref 0–32)
AST: 14 IU/L (ref 0–40)
Alkaline Phosphatase: 82 IU/L (ref 39–117)
BILIRUBIN TOTAL: 1 mg/dL (ref 0.0–1.2)
Bilirubin, Direct: 0.14 mg/dL (ref 0.00–0.40)
Total Protein: 6.8 g/dL (ref 6.0–8.5)

## 2016-06-07 LAB — LIPID PANEL
CHOLESTEROL TOTAL: 330 mg/dL — AB (ref 100–199)
Chol/HDL Ratio: 6.5 ratio units — ABNORMAL HIGH (ref 0.0–4.4)
HDL: 51 mg/dL (ref >39)
LDL CALC: 230 mg/dL — AB (ref 0–99)
Triglycerides: 243 mg/dL — ABNORMAL HIGH (ref 0–149)
VLDL Cholesterol Cal: 49 mg/dL — ABNORMAL HIGH (ref 5–40)

## 2016-06-07 MED ORDER — ROSUVASTATIN CALCIUM 10 MG PO TABS: 10.0000 mg | ORAL_TABLET | Freq: Every day | ORAL | Status: DC

## 2016-06-07 NOTE — Telephone Encounter (Signed)
Reviewed lab results in detail with patient and instructed her on diet and prescription medication. We spoke at length about starting rosuvastatin 10 mg once daily at bedtime and the importance of diet management. Script sent in to preferred pharmacy. Patient verbalized understanding of all instructions and had no further questions at this time.

## 2016-06-07 NOTE — Telephone Encounter (Signed)
-----   Message from Almond LintAileen Ingal, MD sent at 06/07/2016 11:53 AM EDT ----- Recommend rosuvastatin 10mg  po qhs. Cont w low fat heart healthy diet. Report to our office if w side effects.

## 2016-06-14 ENCOUNTER — Encounter
Admission: RE | Admit: 2016-06-14 | Discharge: 2016-06-14 | Disposition: A | Discharge status: Home / Self Care | Payer: BLUE CROSS/BLUE SHIELD | Source: Ambulatory Visit | Attending: Cardiology | Admitting: Cardiology

## 2016-06-14 DIAGNOSIS — R9431 Abnormal electrocardiogram [ECG] [EKG]: Secondary | ICD-10-CM | POA: Insufficient documentation

## 2016-06-14 DIAGNOSIS — R079 Chest pain, unspecified: Secondary | ICD-10-CM | POA: Diagnosis present

## 2016-06-14 LAB — NM MYOCAR MULTI W/SPECT W/WALL MOTION / EF
CHL CUP NUCLEAR SDS: 0
CHL CUP NUCLEAR SRS: 8
CHL CUP NUCLEAR SSS: 3
CHL CUP STRESS STAGE 1 HR: 71 {beats}/min
CHL CUP STRESS STAGE 3 GRADE: 0 %
CHL CUP STRESS STAGE 3 HR: 66 {beats}/min
CHL CUP STRESS STAGE 4 HR: 120 {beats}/min
CHL CUP STRESS STAGE 4 SPEED: 1.7 mph
CHL CUP STRESS STAGE 5 DBP: 93 mmHg
CHL CUP STRESS STAGE 5 SBP: 119 mmHg
CHL CUP STRESS STAGE 5 SPEED: 2.5 mph
CHL CUP STRESS STAGE 6 GRADE: 12 %
CHL CUP STRESS STAGE 7 GRADE: 0 %
CHL CUP STRESS STAGE 7 HR: 123 {beats}/min
CHL CUP STRESS STAGE 8 DBP: 81 mmHg
CHL CUP STRESS STAGE 8 GRADE: 0 %
CSEPED: 6 min
CSEPEDS: 0 s
CSEPEW: 7 METS
CSEPPHR: 142 {beats}/min
CSEPPMHR: 88 %
LVDIAVOL: 92 mL (ref 46–106)
LVSYSVOL: 24 mL
MPHR: 161 {beats}/min
NUC STRESS TID: 0.9
Percent HR: 90 %
Rest HR: 58 {beats}/min
Stage 2 Grade: 0 %
Stage 2 HR: 66 {beats}/min
Stage 2 Speed: 0.1 mph
Stage 3 Speed: 0.1 mph
Stage 4 Grade: 10 %
Stage 5 Grade: 12 %
Stage 5 HR: 142 {beats}/min
Stage 6 HR: 142 {beats}/min
Stage 6 Speed: 2.5 mph
Stage 7 Speed: 0 mph
Stage 8 HR: 65 {beats}/min
Stage 8 SBP: 179 mmHg
Stage 8 Speed: 0 mph

## 2016-06-14 MED ORDER — TECHNETIUM TC 99M TETROFOSMIN IV KIT
30.0000 | PACK | Freq: Once | INTRAVENOUS | Status: AC | PRN
  Administered 2016-06-14: 30.41 via INTRAVENOUS

## 2016-06-14 MED ORDER — TECHNETIUM TC 99M TETROFOSMIN IV KIT
13.0000 | PACK | Freq: Once | INTRAVENOUS | Status: AC | PRN
  Administered 2016-06-14: 12.03 via INTRAVENOUS

## 2016-06-20 ENCOUNTER — Ambulatory Visit (INDEPENDENT_AMBULATORY_CARE_PROVIDER_SITE_OTHER): Payer: BLUE CROSS/BLUE SHIELD

## 2016-06-20 ENCOUNTER — Other Ambulatory Visit: Payer: Self-pay

## 2016-06-20 DIAGNOSIS — R079 Chest pain, unspecified: Secondary | ICD-10-CM | POA: Diagnosis not present

## 2016-06-20 DIAGNOSIS — R9431 Abnormal electrocardiogram [ECG] [EKG]: Secondary | ICD-10-CM | POA: Diagnosis not present

## 2016-06-21 LAB — ECHOCARDIOGRAM COMPLETE
AVLVOTPG: 8 mmHg
CHL CUP STROKE VOLUME: 50 mL
E/e' ratio: 14.04
EWDT: 176 ms
FS: 30 % (ref 28–44)
IV/PV OW: 1.01
LA ID, A-P, ES: 33 mm
LA diam end sys: 33 mm
LA vol index: 24.9 mL/m2
LADIAMINDEX: 1.74 cm/m2
LAVOL: 47.2 mL
LAVOLA4C: 44.4 mL
LDCA: 3.14 cm2
LV E/e' medial: 14.04
LV E/e'average: 14.04
LV TDI E'LATERAL: 8.05
LV TDI E'MEDIAL: 6.42
LVDIAVOL: 94 mL (ref 46–106)
LVDIAVOLIN: 50 mL/m2
LVELAT: 8.05 cm/s
LVOT SV: 95 mL
LVOT VTI: 30.2 cm
LVOT peak vel: 143 cm/s
LVOTD: 20 mm
LVSYSVOL: 45 mL — AB (ref 14–42)
LVSYSVOLIN: 24 mL/m2
MV Dec: 176
MV Peak grad: 5 mmHg
MVPKAVEL: 70.3 m/s
MVPKEVEL: 113 m/s
PV Reg vel dias: 102 cm/s
PW: 10.2 mm — AB (ref 0.6–1.1)
RV TAPSE: 23.6 mm
Simpson's disk: 53

## 2016-07-04 ENCOUNTER — Ambulatory Visit: Payer: BLUE CROSS/BLUE SHIELD | Admitting: Cardiology

## 2016-07-17 ENCOUNTER — Encounter: Payer: Self-pay | Admitting: Cardiology

## 2016-07-17 ENCOUNTER — Ambulatory Visit (INDEPENDENT_AMBULATORY_CARE_PROVIDER_SITE_OTHER): Payer: BLUE CROSS/BLUE SHIELD | Admitting: Cardiology

## 2016-07-17 VITALS — BP 110/68 | HR 57 | Ht 65.0 in | Wt 166.5 lb

## 2016-07-17 DIAGNOSIS — I444 Left anterior fascicular block: Secondary | ICD-10-CM | POA: Diagnosis not present

## 2016-07-17 DIAGNOSIS — E785 Hyperlipidemia, unspecified: Secondary | ICD-10-CM | POA: Diagnosis not present

## 2016-07-17 NOTE — Patient Instructions (Signed)
Labwork: Your physician recommends that you return for lab work in: 3 months for fasting labs. Do not eat or drink after midnight the night before you come in for testing.   Follow-Up: Your physician wants you to follow-up in: 3 months with Dr. Alvino Chapel. You will receive a reminder letter in the mail two months in advance. If you don't receive a letter, please call our office to schedule the follow-up appointment.  It was a pleasure seeing you today here in the office. Please do not hesitate to give [place] a call back if you have any further questions. [phone]  Bryan Cellar RN, BSN

## 2016-07-17 NOTE — Progress Notes (Signed)
Cardiology Office Note   Date:  07/17/2016   ID:  Samantha Gray, DOB Nov 19, 1956, MRN [id number]  Referring Doctor:  Andi Devon, MD   Cardiologist:   Wende Bushy, MD   Reason for consultation:  Chief Complaint  Patient presents with  . Other    Follow up from [facility]. Meds reviewed by the patient verbally. "doing well."       History of Present Illness: Samantha Gray is a 60 y.o. female who presents for Follow-up for tests.  Patient has been doing well, no recurrence of chest pain so far.  In terms of her hyperlipidemia, she seems to be tolerating the rosuvastatin. No muscle cramps.  Patient does not complain of shortness of breath. No palpitations. No fever, cough, colds, abdominal pain. No PND, orthopnea, edema   ROS:  Please see the history of present illness. Aside from mentioned under HPI, all other systems are reviewed and negative.    Past Medical History:  Diagnosis Date  . GERD (gastroesophageal reflux disease)   . Hyperlipidemia     Past Surgical History:  Procedure Laterality Date  . CESAREAN SECTION    . KNEE SURGERY       reports that she has never smoked. She has never used smokeless tobacco. She reports that she does not drink alcohol or use drugs.   family history includes Heart attack in her brother; Heart attack (age of onset: 67) in her father; Heart attack (age of onset: 61) in her brother; Heart disease in her brother; Hyperlipidemia in her brother and brother; Hypertension in her brother and brother. A brother developed heart disease at age 36, brother smokes and drinks heavily  Current Outpatient Prescriptions  Medication Sig Dispense Refill  . aspirin EC 81 MG tablet Take 1 tablet (81 mg total) by mouth daily. 90 tablet 3  . fluticasone (FLONASE) 50 MCG/ACT nasal spray Place 2 sprays into the nose daily.     Marland Kitchen ibuprofen (GOODSENSE IBUPROFEN) 200 MG tablet Take 200 mg by mouth every 6 (six) hours as needed.     .  loratadine (CLARITIN) 10 MG tablet Take 10 mg by mouth daily.     . Multiple Vitamin (MULTIVITAMIN) capsule Take 1 capsule by mouth daily.     . nitroGLYCERIN (NITROSTAT) 0.4 MG SL tablet Place 1 tablet (0.4 mg total) under the tongue every 5 (five) minutes as needed for chest pain. 25 tablet 6  . rosuvastatin (CRESTOR) 10 MG tablet Take 1 tablet (10 mg total) by mouth daily. 30 tablet 6  . sertraline (ZOLOFT) 100 MG tablet Take 100 mg by mouth daily.   0  . valACYclovir (VALTREX) 500 MG tablet Take 500 mg by mouth daily as needed.      No current facility-administered medications for this visit.     Allergies: Codeine and Prednisone    PHYSICAL EXAM: VS:  BP 110/68 (BP Location: Left Arm, Patient Position: Sitting, Cuff Size: Normal)   Pulse (!) 57   Ht '5\' 5"'$  (1.651 m)   Wt 166 lb 8 oz (75.5 kg)   SpO2 97%   BMI 27.71 kg/m  , Body mass index is 27.71 kg/m. Wt Readings from Last 3 Encounters:  07/17/16 166 lb 8 oz (75.5 kg)  06/06/16 169 lb (76.7 kg)  05/16/16 165 lb (74.8 kg)    GENERAL:  well developed, well nourished, not in acute distress HEENT: normocephalic, pink conjunctivae, anicteric sclerae, no xanthelasma, normal dentition, oropharynx clear NECK:  no neck vein engorgement, JVP normal, no hepatojugular reflux, carotid upstroke brisk and symmetric, no bruit, no thyromegaly, no lymphadenopathy LUNGS:  good respiratory effort, clear to auscultation bilaterally CV:  PMI not displaced, no thrills, no lifts, S1 and S2 within normal limits, no palpable S3 or S4, no murmurs, no rubs, no gallops ABD:  Soft, nontender, nondistended, normoactive bowel sounds, no abdominal aortic bruit, no hepatomegaly, no splenomegaly MS: nontender back, no kyphosis, no scoliosis, no joint deformities EXT:  2+ DP/PT pulses, no edema, no varicosities, no cyanosis, no clubbing SKIN: warm, nondiaphoretic, normal turgor, no ulcers NEUROPSYCH: alert, oriented to person, place, and time, sensory/motor  grossly intact, normal mood, appropriate affect  Recent Labs: 05/16/2016: BUN 10; Creatinine, Ser 0.80; Hemoglobin 13.2; Platelets 210; Potassium 3.9; Sodium 140 06/06/2016: ALT 11   Lipid Panel    Component Value Date/Time   CHOL 330 (H) 06/06/2016 1129   TRIG 243 (H) 06/06/2016 1129   HDL 51 06/06/2016 1129   CHOLHDL 6.5 (H) 06/06/2016 1129   LDLCALC 230 (H) 06/06/2016 1129     Other studies Reviewed:  EKG:  The ekg from 06/06/2016 was personally reviewed by me and it revealed sinus rhythm, 77 BPM. LAFB. Abnormal EKG.  Additional studies/ records that were reviewed personally reviewed by me today include:  Echo 06/20/2016: Left ventricle: The cavity size was normal. Systolic function was   normal. The estimated ejection fraction was in the range of 60%   to 65%. Wall motion was normal; there were no regional wall   motion abnormalities. Left ventricular diastolic function   parameters were normal. - Left atrium: The atrium was normal in size. - Right ventricle: Systolic function was normal. - Pulmonary arteries: Systolic pressure was within the normal   range.  Nuclear stress this 06/14/2016:  There was no ST segment deviation noted during stress.  No T wave inversion was noted during stress.  The study is normal.  This is a low risk study.  The left ventricular ejection fraction is normal (55-65%).  Defect 1: There is a small defect of mild severity present in the apex location. This is likely due to breast attenuation.  ASSESSMENT AND PLAN: Chest tightness or chest pain LAFB/abnormal EKG Risk factors for CAD include hyperlipidemia, and significant family history of premature CAD. No evidence of ischemia on nuclear stress test. LVEF was within normal limits. Discussed findings at length with patient and husband. Patient reassured. Likelihood of clinically significant CAD based on normal stress test is low. Continue with risk factor modification. Continue aspirin  81 mg by mouth daily.  Hyperlipidemia Continue rosuvastatin 10 mg by mouth daily at bedtime. Repeat fasting lipid panel and CMP and 12 weeks. Ideally, goal LDL is less than 70 due to premature CAD in family history. Continue with lifestyle changes.  Current medicines are reviewed at length with the patient today.  The patient does not have concerns regarding medicines.  Labs/ tests ordered today include:  Orders Placed This Encounter  Procedures  . Comp Met (CMET)  . Lipid Profile    I had a lengthy and detailed discussion with the patient regarding diagnoses, prognosis, diagnostic options, treatment options , and side effects of medications.   I counseled the patient on importance of lifestyle modification including heart healthy diet, regular physical activity.  Disposition:   FU with undersigned in 12 weeks  Signed, Wende Bushy, MD  07/17/2016 9:41 AM    [facility]

## 2016-10-16 ENCOUNTER — Other Ambulatory Visit: Payer: BLUE CROSS/BLUE SHIELD

## 2016-11-28 ENCOUNTER — Ambulatory Visit (INDEPENDENT_AMBULATORY_CARE_PROVIDER_SITE_OTHER): Payer: BLUE CROSS/BLUE SHIELD | Admitting: Cardiology

## 2016-11-28 ENCOUNTER — Encounter: Payer: Self-pay | Admitting: Cardiology

## 2016-11-28 VITALS — BP 102/70 | HR 65 | Ht 65.0 in | Wt 169.0 lb

## 2016-11-28 DIAGNOSIS — E784 Other hyperlipidemia: Secondary | ICD-10-CM

## 2016-11-28 DIAGNOSIS — E7849 Other hyperlipidemia: Secondary | ICD-10-CM

## 2016-11-28 DIAGNOSIS — R9431 Abnormal electrocardiogram [ECG] [EKG]: Secondary | ICD-10-CM

## 2016-11-28 NOTE — Progress Notes (Addendum)
Cardiology Office Note   Date:  11/28/2016   ID:  Samantha Gray, DOB 1956-07-24, MRN [id number]  Referring Doctor:  Samantha Devon, MD   Cardiologist:   Samantha Bushy, MD   Reason for consultation:  Chief Complaint  Patient presents with  . OTHER    OD 3 month f/u. Meds reviewed verbally with pt.      History of Present Illness: Samantha Gray is a 60 y.o. female who presents for Follow-up For hyperlipidemia.  Patient has been doing well, no recurrence of chest pain so far. She has been tolerating Crestor. She does occasionally have muscle cramps.  Patient does not complain of shortness of breath. No palpitations. Currently dealing with sinus congestion. No PND, orthopnea, edema   ROS:  Please see the history of present illness. Aside from mentioned under HPI, all other systems are reviewed and negative.    Past Medical History:  Diagnosis Date  . GERD (gastroesophageal reflux disease)   . Hyperlipidemia     Past Surgical History:  Procedure Laterality Date  . CESAREAN SECTION    . KNEE SURGERY       reports that she has never smoked. She has never used smokeless tobacco. She reports that she does not drink alcohol or use drugs.   family history includes Heart attack in her brother; Heart attack (age of onset: 74) in her father; Heart attack (age of onset: 34) in her brother; Heart disease in her brother; Hyperlipidemia in her brother and brother; Hypertension in her brother and brother. A brother developed heart disease at age 69, brother smokes and drinks heavily  Current Outpatient Prescriptions  Medication Sig Dispense Refill  . aspirin EC 81 MG tablet Take 1 tablet (81 mg total) by mouth daily. 90 tablet 3  . fluticasone (FLONASE) 50 MCG/ACT nasal spray Place 2 sprays into the nose daily.     Marland Kitchen ibuprofen (GOODSENSE IBUPROFEN) 200 MG tablet Take 200 mg by mouth every 6 (six) hours as needed.     . loratadine (CLARITIN) 10 MG tablet Take 10 mg by mouth  daily.     . Multiple Vitamin (MULTIVITAMIN) capsule Take 1 capsule by mouth daily.     . nitroGLYCERIN (NITROSTAT) 0.4 MG SL tablet Place 1 tablet (0.4 mg total) under the tongue every 5 (five) minutes as needed for chest pain. 25 tablet 6  . rosuvastatin (CRESTOR) 10 MG tablet Take 1 tablet (10 mg total) by mouth daily. 30 tablet 6  . sertraline (ZOLOFT) 100 MG tablet Take 100 mg by mouth daily.   0   No current facility-administered medications for this visit.     Allergies: Codeine and Prednisone    PHYSICAL EXAM: VS:  BP 102/70 (BP Location: Left Arm, Patient Position: Sitting, Cuff Size: Normal)   Pulse 65   Ht 5' 5" (1.651 m)   Wt 169 lb (76.7 kg)   BMI 28.12 kg/m  , Body mass index is 28.12 kg/m. Wt Readings from Last 3 Encounters:  11/28/16 169 lb (76.7 kg)  07/17/16 166 lb 8 oz (75.5 kg)  06/06/16 169 lb (76.7 kg)    GENERAL:  well developed, well nourished, not in acute distress HEENT: normocephalic, pink conjunctivae, anicteric sclerae, no xanthelasma, normal dentition, oropharynx clear NECK:  no neck vein engorgement, JVP normal, no hepatojugular reflux, carotid upstroke brisk and symmetric, no bruit, no thyromegaly, no lymphadenopathy LUNGS:  good respiratory effort, clear to auscultation bilaterally CV:  PMI not displaced, no thrills, no  lifts, S1 and S2 within normal limits, no palpable S3 or S4, no murmurs, no rubs, no gallops ABD:  Soft, nontender, nondistended, normoactive bowel sounds, no abdominal aortic bruit, no hepatomegaly, no splenomegaly MS: nontender back, no kyphosis, no scoliosis, no joint deformities EXT:  2+ DP/PT pulses, no edema, no varicosities, no cyanosis, no clubbing SKIN: warm, nondiaphoretic, normal turgor, no ulcers NEUROPSYCH: alert, oriented to person, place, and time, sensory/motor grossly intact, normal mood, appropriate affect  Recent Labs: 05/16/2016: BUN 10; Creatinine, Ser 0.80; Hemoglobin 13.2; Platelets 210; Potassium 3.9; Sodium  140 06/06/2016: ALT 11   Lipid Panel    Component Value Date/Time   CHOL 330 (H) 06/06/2016 1129   TRIG 243 (H) 06/06/2016 1129   HDL 51 06/06/2016 1129   CHOLHDL 6.5 (H) 06/06/2016 1129   LDLCALC 230 (H) 06/06/2016 1129     Other studies Reviewed:  EKG:  The ekg from 06/06/2016 was personally reviewed by me and it revealed sinus rhythm, 77 BPM. LAFB. Abnormal EKG.  Additional studies/ records that were reviewed personally reviewed by me today include:  Echo 06/20/2016: Left ventricle: The cavity size was normal. Systolic function was   normal. The estimated ejection fraction was in the range of 60%   to 65%. Wall motion was normal; there were no regional wall   motion abnormalities. Left ventricular diastolic function   parameters were normal. - Left atrium: The atrium was normal in size. - Right ventricle: Systolic function was normal. - Pulmonary arteries: Systolic pressure was within the normal   range.  Nuclear stress this 06/14/2016:  There was no ST segment deviation noted during stress.  No T wave inversion was noted during stress.  The study is normal.  This is a low risk study.  The left ventricular ejection fraction is normal (55-65%).  Defect 1: There is a small defect of mild severity present in the apex location. This is likely due to breast attenuation.  ASSESSMENT AND PLAN: Chest tightness or chest pain LAFB/abnormal EKG Risk factors for CAD include hyperlipidemia, and significant family history of premature CAD. No evidence of ischemia on nuclear stress test. LVEF was within normal limits. Discussed findings at length with patient and husband. Patient reassured. Likelihood of clinically significant CAD based on normal stress test is low. Continue with risk factor modification. Continue aspirin 81 mg by mouth daily.  Hyperlipidemia Continue rosuvastatin 10 mg by mouth daily at bedtime.May try coenzyme Q10 for muscle pains. Repeat fasting lipid panel  and CMP. Ideally, goal LDL is less than 70 due to premature CAD in family history. Continue with lifestyle changes.  Current medicines are reviewed at length with the patient today.  The patient does not have concerns regarding medicines.  Labs/ tests ordered today include:  Orders Placed This Encounter  Procedures  . Comp Met (CMET)  . Lipid Profile  . EKG 12-Lead    I had a lengthy and detailed discussion with the patient regarding diagnoses, prognosis, diagnostic options, treatment options , and side effects of medications.   I counseled the patient on importance of lifestyle modification including heart healthy diet, regular physical activity.  Disposition:   FU with undersigned in 6 months depending on results of her blood work  Signed, Samantha Bushy, MD  11/28/2016 10:52 AM    [place]

## 2016-11-28 NOTE — Patient Instructions (Signed)
Medication Instructions:  Dr. Alvino ChapelIngal wants you to try over the counter Co Q 10 supplement.   Labwork: Labs done today were complete metabolic panel and lipid profile. We will call you with these results.   Follow-Up: Your physician wants you to follow-up in: 6 months with Dr. Alvino ChapelIngal. You will receive a reminder letter in the mail two months in advance. If you don't receive a letter, please call our office to schedule the follow-up appointment.  It was a pleasure seeing you today here in the office. Please do not hesitate to give us a call back if you have any further questions. [phone]  Tracy CellarPamela A. RN, BSN

## 2016-11-29 LAB — COMPREHENSIVE METABOLIC PANEL
ALBUMIN: 4.4 g/dL (ref 3.6–4.8)
ALK PHOS: 101 IU/L (ref 39–117)
ALT: 13 IU/L (ref 0–32)
AST: 18 IU/L (ref 0–40)
Albumin/Globulin Ratio: 2 (ref 1.2–2.2)
BILIRUBIN TOTAL: 1 mg/dL (ref 0.0–1.2)
BUN / CREAT RATIO: 14 (ref 12–28)
BUN: 12 mg/dL (ref 8–27)
CO2: 24 mmol/L (ref 18–29)
CREATININE: 0.83 mg/dL (ref 0.57–1.00)
Calcium: 9.6 mg/dL (ref 8.7–10.3)
Chloride: 102 mmol/L (ref 96–106)
GFR calc Af Amer: 89 mL/min/{1.73_m2} (ref >59)
GFR calc non Af Amer: 77 mL/min/{1.73_m2} (ref >59)
GLOBULIN, TOTAL: 2.2 g/dL (ref 1.5–4.5)
Glucose: 75 mg/dL (ref 65–99)
Potassium: 4.5 mmol/L (ref 3.5–5.2)
SODIUM: 141 mmol/L (ref 134–144)
Total Protein: 6.6 g/dL (ref 6.0–8.5)

## 2016-11-29 LAB — LIPID PANEL
CHOLESTEROL TOTAL: 245 mg/dL — AB (ref 100–199)
Chol/HDL Ratio: 3.8 ratio units (ref 0.0–4.4)
HDL: 65 mg/dL (ref >39)
LDL CALC: 147 mg/dL — AB (ref 0–99)
Triglycerides: 163 mg/dL — ABNORMAL HIGH (ref 0–149)
VLDL Cholesterol Cal: 33 mg/dL (ref 5–40)

## 2016-12-27 ENCOUNTER — Other Ambulatory Visit: Payer: Self-pay | Admitting: Cardiology

## 2016-12-27 DIAGNOSIS — E785 Hyperlipidemia, unspecified: Secondary | ICD-10-CM

## 2016-12-27 MED ORDER — ROSUVASTATIN CALCIUM 10 MG PO TABS: 10.0000 mg | ORAL_TABLET | Freq: Every day | ORAL | 3 refills | Status: DC

## 2017-02-23 ENCOUNTER — Other Ambulatory Visit: Payer: Self-pay | Admitting: Cardiology

## 2017-02-23 DIAGNOSIS — E785 Hyperlipidemia, unspecified: Secondary | ICD-10-CM

## 2019-04-29 ENCOUNTER — Telehealth: Payer: Self-pay

## 2019-04-29 NOTE — Telephone Encounter (Signed)
Called patient from recall.  No answer. Vm was full.  This is the second attempt per recall list.

## 2019-05-18 NOTE — Telephone Encounter (Signed)
Called patient from recall list.  Patient does not want an appointment with [place] at all.  Made her aware that I would delete the recall.

## 2020-01-24 ENCOUNTER — Emergency Department
Admission: EM | Admit: 2020-01-24 | Discharge: 2020-01-24 | Disposition: A | Discharge status: Home / Self Care | Payer: BC Managed Care – PPO | Attending: Emergency Medicine | Admitting: Emergency Medicine

## 2020-01-24 ENCOUNTER — Emergency Department: Payer: BC Managed Care – PPO

## 2020-01-24 ENCOUNTER — Encounter: Payer: Self-pay | Admitting: Emergency Medicine

## 2020-01-24 ENCOUNTER — Other Ambulatory Visit: Payer: Self-pay

## 2020-01-24 DIAGNOSIS — Z7982 Long term (current) use of aspirin: Secondary | ICD-10-CM | POA: Insufficient documentation

## 2020-01-24 DIAGNOSIS — M25512 Pain in left shoulder: Secondary | ICD-10-CM | POA: Diagnosis not present

## 2020-01-24 DIAGNOSIS — Z79899 Other long term (current) drug therapy: Secondary | ICD-10-CM | POA: Diagnosis not present

## 2020-01-24 LAB — CBC WITH DIFFERENTIAL/PLATELET
Abs Immature Granulocytes: 0.01 10*3/uL (ref 0.00–0.07)
Basophils Absolute: 0 10*3/uL (ref 0.0–0.1)
Basophils Relative: 1 %
Eosinophils Absolute: 0.1 10*3/uL (ref 0.0–0.5)
Eosinophils Relative: 2 %
HCT: 38 % (ref 36.0–46.0)
Hemoglobin: 12.6 g/dL (ref 12.0–15.0)
Immature Granulocytes: 0 %
Lymphocytes Relative: 33 %
Lymphs Abs: 1.6 10*3/uL (ref 0.7–4.0)
MCH: 28.5 pg (ref 26.0–34.0)
MCHC: 33.2 g/dL (ref 30.0–36.0)
MCV: 86 fL (ref 80.0–100.0)
Monocytes Absolute: 0.4 10*3/uL (ref 0.1–1.0)
Monocytes Relative: 8 %
Neutro Abs: 2.7 10*3/uL (ref 1.7–7.7)
Neutrophils Relative %: 56 %
Platelets: 197 10*3/uL (ref 150–400)
RBC: 4.42 MIL/uL (ref 3.87–5.11)
RDW: 12.7 % (ref 11.5–15.5)
WBC: 4.8 10*3/uL (ref 4.0–10.5)
nRBC: 0 % (ref 0.0–0.2)

## 2020-01-24 LAB — COMPREHENSIVE METABOLIC PANEL
ALT: 14 U/L (ref 0–44)
AST: 19 U/L (ref 15–41)
Albumin: 4 g/dL (ref 3.5–5.0)
Alkaline Phosphatase: 74 U/L (ref 38–126)
Anion gap: 7 (ref 5–15)
BUN: 10 mg/dL (ref 8–23)
CO2: 26 mmol/L (ref 22–32)
Calcium: 9.1 mg/dL (ref 8.9–10.3)
Chloride: 107 mmol/L (ref 98–111)
Creatinine, Ser: 0.85 mg/dL (ref 0.44–1.00)
GFR calc Af Amer: >60 mL/min (ref >60)
GFR calc non Af Amer: >60 mL/min (ref >60)
Glucose, Bld: 105 mg/dL — ABNORMAL HIGH (ref 70–99)
Potassium: 4.2 mmol/L (ref 3.5–5.1)
Sodium: 140 mmol/L (ref 135–145)
Total Bilirubin: 1.3 mg/dL — ABNORMAL HIGH (ref 0.3–1.2)
Total Protein: 6.8 g/dL (ref 6.5–8.1)

## 2020-01-24 LAB — TROPONIN I (HIGH SENSITIVITY): Troponin I (High Sensitivity): 3 ng/L (ref <18)

## 2020-01-24 MED ORDER — TRAMADOL HCL 50 MG PO TABS: 50.0000 mg | ORAL_TABLET | Freq: Four times a day (QID) | ORAL | 0 refills | Status: DC | PRN

## 2020-01-24 MED ORDER — TRAMADOL HCL 50 MG PO TABS: 50.0000 mg | ORAL_TABLET | Freq: Four times a day (QID) | ORAL | 0 refills | Status: AC | PRN

## 2020-01-24 MED ORDER — METHOCARBAMOL 500 MG PO TABS: 500.0000 mg | ORAL_TABLET | Freq: Four times a day (QID) | ORAL | 0 refills | Status: DC | PRN

## 2020-01-24 NOTE — Discharge Instructions (Signed)
Follow-up with your primary care provider if any continued problems.  Also consider seeing the orthopedist listed on your discharge paper or an orthopedist that you have seen in the past if you continue to have problems with your left shoulder.  All test today ruled out this being a cardiac event.  Continue taking your meloxicam as prescribed by your doctor.  The methocarbamol is the muscle relaxant and tramadol is for pain.  You may also use ice or heat to your shoulder as needed for discomfort.  Do not drive or operate machinery while taking the 2 medications prescribed for you today.  Return to the emergency department if any severe worsening of your symptoms or urgent concerns.

## 2020-01-24 NOTE — ED Provider Notes (Signed)
[facility] Emergency Department Provider Note  ____________________________________________   First MD Initiated Contact with Patient 01/24/20 1318     (approximate)  I have reviewed the triage vital signs and the nursing notes.   HISTORY  Chief Complaint No chief complaint on file.   HPI Samantha Gray is a 64 y.o. female presents to the ED with complaint of left shoulder pain.  Patient states this started 2 days ago.  She initially said she had no injury prior to her pain however she now relates that she was walking up some steps and hit her shoulder against the wall.  She complains of shoulder pain with radiation down her left arm.  She denies any shortness of breath, diaphoresis, indigestion or previous cardiac history.  Pain is worse with moving her left arm.  She rates her pain as a 10/10.      Past Medical History:  Diagnosis Date  . GERD (gastroesophageal reflux disease)   . Hyperlipidemia     There are no problems to display for this patient.   Past Surgical History:  Procedure Laterality Date  . CESAREAN SECTION    . KNEE SURGERY      Prior to Admission medications   Medication Sig Start Date End Date Taking? Authorizing Provider  aspirin EC 81 MG tablet Take 1 tablet (81 mg total) by mouth daily. 06/06/16   Almond Lint, MD  fluticasone (FLONASE) 50 MCG/ACT nasal spray Place 2 sprays into the nose daily.  01/01/16 12/31/16  [provider]  ibuprofen (GOODSENSE IBUPROFEN) 200 MG tablet Take 200 mg by mouth every 6 (six) hours as needed.     [provider]  loratadine (CLARITIN) 10 MG tablet Take 10 mg by mouth daily.     [provider]  methocarbamol (ROBAXIN) 500 MG tablet Take 1 tablet (500 mg total) by mouth every 6 (six) hours as needed. 01/24/20   Tommi Rumps, PA-C  Multiple Vitamin (MULTIVITAMIN) capsule Take 1 capsule by mouth daily.  02/23/09   [provider]  nitroGLYCERIN (NITROSTAT) 0.4  MG SL tablet Place 1 tablet (0.4 mg total) under the tongue every 5 (five) minutes as needed for chest pain. 06/06/16   Almond Lint, MD  rosuvastatin (CRESTOR) 10 MG tablet TAKE 1 TABLET BY MOUTH  DAILY 02/24/17   Almond Lint, MD  sertraline (ZOLOFT) 100 MG tablet Take 100 mg by mouth daily.  07/09/16   [provider]  traMADol (ULTRAM) 50 MG tablet Take 1 tablet (50 mg total) by mouth every 6 (six) hours as needed. 01/24/20   Tommi Rumps, PA-C    Allergies Codeine and Prednisone  Family History  Problem Relation Age of Onset  . Heart attack Father 4  . Heart attack Brother 29  . Hypertension Brother   . Hyperlipidemia Brother   . Heart attack Brother   . Hyperlipidemia Brother   . Hypertension Brother   . Heart disease Brother        stents     Social History Social History   Tobacco Use  . Smoking status: Never Smoker  . Smokeless tobacco: Never Used  Substance Use Topics  . Alcohol use: No  . Drug use: No    Review of Systems Constitutional: No fever/chills Cardiovascular: Denies chest pain. Respiratory: Denies shortness of breath. Gastrointestinal: No abdominal pain.  No nausea, no vomiting.   Genitourinary: Negative for dysuria. Musculoskeletal: Positive for left shoulder pain. Skin: Negative for rash. Neurological: Negative  for headaches, focal weakness or numbness. ____________________________________________   PHYSICAL EXAM:  VITAL SIGNS: ED Triage Vitals  Enc Vitals Group     BP 01/24/20 1124 124/82     Pulse Rate 01/24/20 1124 73     Resp 01/24/20 1124 18     Temp 01/24/20 1124 97.7 F (36.5 C)     Temp Source 01/24/20 1124 Oral     SpO2 01/24/20 1124 98 %     Weight 01/24/20 1134 167 lb (75.8 kg)     Height 01/24/20 1134 5\' 5"  (1.651 m)     Head Circumference --      Peak Flow --      Pain Score 01/24/20 1134 10     Pain Loc --      Pain Edu? --      Excl. in GC? --     Constitutional: Alert and oriented. Well appearing and  in no acute distress. Eyes: Conjunctivae are normal.  Head: Atraumatic. Nose: No congestion/rhinnorhea. Neck: No stridor.   Cardiovascular: Normal rate, regular rhythm. Grossly normal heart sounds.  Good peripheral circulation. Respiratory: Normal respiratory effort.  No retractions. Lungs CTAB. Gastrointestinal: Soft and nontender. No distention.  Musculoskeletal: Examination of the left shoulder there is no gross deformity.  Range of motion is restricted secondary to discomfort.  On palpation there is moderate tenderness on palpation posterior parascapular muscles.  No point tenderness is noted on palpation of the cervical spine posteriorly.  Skin is intact.  No erythema, ecchymosis or abrasions are noted. Neurologic:  Normal speech and language. No gross focal neurologic deficits are appreciated.  Skin:  Skin is warm, dry and intact. No rash noted. Psychiatric: Mood and affect are normal. Speech and behavior are normal.  ____________________________________________   LABS (all labs ordered are listed, but only abnormal results are displayed)  Labs Reviewed  COMPREHENSIVE METABOLIC PANEL - Abnormal; Notable for the following components:      Result Value   Glucose, Bld 105 (*)    Total Bilirubin 1.3 (*)    All other components within normal limits  CBC WITH DIFFERENTIAL/PLATELET  TROPONIN I (HIGH SENSITIVITY)  TROPONIN I (HIGH SENSITIVITY)   ____________________________________________  EKG EKG was reviewed by Dr. 03/23/20. Normal sinus rhythm with ventricular rate of 64, PR interval 178, QRS duration 94.  ____________________________________________  RADIOLOGY   Official radiology report(s): DG Shoulder Left  Result Date: 01/24/2020 CLINICAL DATA:  Left shoulder and axillary pain radiating down the left arm for 2 days. EXAM: LEFT SHOULDER - 2+ VIEW COMPARISON:  None. FINDINGS: There is no evidence of fracture or dislocation. There is no evidence of arthropathy or other  focal bone abnormality. Soft tissues are unremarkable. IMPRESSION: Negative. Electronically Signed   By: 03/23/2020 M.D.   On: 01/24/2020 14:13    ____________________________________________   PROCEDURES  Procedure(s) performed (including Critical Care):  Procedures   ____________________________________________   INITIAL IMPRESSION / ASSESSMENT AND PLAN / ED COURSE  As part of my medical decision making, I reviewed the following data within the electronic MEDICAL RECORD NUMBER Notes from prior ED visits and [place] Controlled Substance Database  64 year old female presents to the ED with complaint of left shoulder pain that started 2 days ago.  Patient relates after triage that she did have a minor injury to her shoulder Saturday prior to her symptoms.  A cardiac work-up was done and all was within normal limits and patient was reassured.  X-ray of her shoulder was negative for any  acute bony injury.  Patient presently takes meloxicam for other degenerative complaints.  A prescription for Robaxin and tramadol was sent to her pharmacy.  She is encouraged to use ice to her shoulder as needed for discomfort.  She is to follow-up with her PCP if any continued problems.  ____________________________________________   FINAL CLINICAL IMPRESSION(S) / ED DIAGNOSES  Final diagnoses:  Acute pain of left shoulder     ED Discharge Orders         Ordered    traMADol (ULTRAM) 50 MG tablet  Every 6 hours PRN,   Status:  Discontinued     01/24/20 1424    methocarbamol (ROBAXIN) 500 MG tablet  Every 6 hours PRN,   Status:  Discontinued     01/24/20 1424    methocarbamol (ROBAXIN) 500 MG tablet  Every 6 hours PRN,   Status:  Discontinued     01/24/20 1438    traMADol (ULTRAM) 50 MG tablet  Every 6 hours PRN,   Status:  Discontinued     01/24/20 1438    methocarbamol (ROBAXIN) 500 MG tablet  Every 6 hours PRN     01/24/20 1440    traMADol (ULTRAM) 50 MG tablet  Every 6 hours PRN     01/24/20 1440             Note:  This document was prepared using Dragon voice recognition software and may include unintentional dictation errors.    Johnn Hai, PA-C 01/24/20 1556    Vanessa Collins, MD 01/25/20 1055

## 2020-01-24 NOTE — ED Triage Notes (Signed)
Presents with left shouldler pain  States pain started on Saturday w/o injury   Became worse early this am  Pain is moving into left arm

## 2020-11-29 NOTE — Progress Notes (Signed)
Patient tested positive for Covid on Sep 28, 2020. Received infusion therapy Oct 03, 2020. No covid test needed prior to surgery per anesthesia protocol.

## 2020-11-30 ENCOUNTER — Inpatient Hospital Stay (HOSPITAL_COMMUNITY)
Admission: RE | Admit: 2020-11-30 | Discharge: 2020-12-02 | DRG: 488 | Disposition: A | Discharge status: Home Health | Payer: Self-pay | Attending: Orthopedic Surgery | Admitting: Orthopedic Surgery

## 2020-11-30 ENCOUNTER — Other Ambulatory Visit: Payer: Self-pay

## 2020-11-30 ENCOUNTER — Encounter (HOSPITAL_COMMUNITY): Payer: Self-pay | Admitting: Orthopedic Surgery

## 2020-11-30 ENCOUNTER — Encounter (HOSPITAL_COMMUNITY)
Admission: RE | Disposition: A | Discharge status: Home Health | Payer: Self-pay | Source: Home / Self Care | Attending: Orthopedic Surgery

## 2020-11-30 ENCOUNTER — Ambulatory Visit (HOSPITAL_COMMUNITY): Payer: Self-pay | Admitting: Certified Registered Nurse Anesthetist

## 2020-11-30 ENCOUNTER — Ambulatory Visit (HOSPITAL_COMMUNITY): Payer: Self-pay

## 2020-11-30 ENCOUNTER — Inpatient Hospital Stay (HOSPITAL_COMMUNITY): Payer: Self-pay

## 2020-11-30 DIAGNOSIS — D62 Acute posthemorrhagic anemia: Secondary | ICD-10-CM | POA: Diagnosis not present

## 2020-11-30 DIAGNOSIS — M1990 Unspecified osteoarthritis, unspecified site: Secondary | ICD-10-CM | POA: Diagnosis present

## 2020-11-30 DIAGNOSIS — S82101A Unspecified fracture of upper end of right tibia, initial encounter for closed fracture: Secondary | ICD-10-CM | POA: Diagnosis present

## 2020-11-30 DIAGNOSIS — T1490XA Injury, unspecified, initial encounter: Secondary | ICD-10-CM

## 2020-11-30 DIAGNOSIS — W1839XA Other fall on same level, initial encounter: Secondary | ICD-10-CM | POA: Diagnosis present

## 2020-11-30 DIAGNOSIS — S82141A Displaced bicondylar fracture of right tibia, initial encounter for closed fracture: Principal | ICD-10-CM | POA: Diagnosis present

## 2020-11-30 DIAGNOSIS — Z79899 Other long term (current) drug therapy: Secondary | ICD-10-CM

## 2020-11-30 DIAGNOSIS — K219 Gastro-esophageal reflux disease without esophagitis: Secondary | ICD-10-CM | POA: Diagnosis present

## 2020-11-30 DIAGNOSIS — Z20822 Contact with and (suspected) exposure to covid-19: Secondary | ICD-10-CM | POA: Diagnosis present

## 2020-11-30 DIAGNOSIS — Z886 Allergy status to analgesic agent status: Secondary | ICD-10-CM

## 2020-11-30 DIAGNOSIS — T148XXA Other injury of unspecified body region, initial encounter: Secondary | ICD-10-CM

## 2020-11-30 DIAGNOSIS — Z83438 Family history of other disorder of lipoprotein metabolism and other lipidemia: Secondary | ICD-10-CM

## 2020-11-30 DIAGNOSIS — E785 Hyperlipidemia, unspecified: Secondary | ICD-10-CM | POA: Diagnosis present

## 2020-11-30 DIAGNOSIS — S83289A Other tear of lateral meniscus, current injury, unspecified knee, initial encounter: Secondary | ICD-10-CM | POA: Diagnosis present

## 2020-11-30 DIAGNOSIS — Z791 Long term (current) use of non-steroidal anti-inflammatories (NSAID): Secondary | ICD-10-CM

## 2020-11-30 DIAGNOSIS — Z885 Allergy status to narcotic agent status: Secondary | ICD-10-CM

## 2020-11-30 DIAGNOSIS — Z8249 Family history of ischemic heart disease and other diseases of the circulatory system: Secondary | ICD-10-CM

## 2020-11-30 DIAGNOSIS — Z888 Allergy status to other drugs, medicaments and biological substances status: Secondary | ICD-10-CM

## 2020-11-30 DIAGNOSIS — F32A Depression, unspecified: Secondary | ICD-10-CM | POA: Diagnosis present

## 2020-11-30 HISTORY — DX: Other specified postprocedural states: R11.2

## 2020-11-30 HISTORY — DX: Other specified postprocedural states: Z98.890

## 2020-11-30 HISTORY — PX: FASCIOTOMY: SHX132

## 2020-11-30 HISTORY — PX: ORIF TIBIA PLATEAU: SHX2132

## 2020-11-30 HISTORY — DX: Unspecified osteoarthritis, unspecified site: M19.90

## 2020-11-30 HISTORY — PX: MENISCUS REPAIR: SHX5179

## 2020-11-30 LAB — URINALYSIS, ROUTINE W REFLEX MICROSCOPIC
Bilirubin Urine: NEGATIVE
Glucose, UA: NEGATIVE mg/dL
Hgb urine dipstick: NEGATIVE
Ketones, ur: NEGATIVE mg/dL
Leukocytes,Ua: NEGATIVE
Nitrite: NEGATIVE
Protein, ur: NEGATIVE mg/dL
Specific Gravity, Urine: 1.01 (ref 1.005–1.030)
pH: 7 (ref 5.0–8.0)

## 2020-11-30 LAB — VITAMIN D 25 HYDROXY (VIT D DEFICIENCY, FRACTURES): Vit D, 25-Hydroxy: 18.41 ng/mL — ABNORMAL LOW (ref 30–100)

## 2020-11-30 LAB — CBC WITH DIFFERENTIAL/PLATELET
Abs Immature Granulocytes: 0.01 10*3/uL (ref 0.00–0.07)
Basophils Absolute: 0 10*3/uL (ref 0.0–0.1)
Basophils Relative: 1 %
Eosinophils Absolute: 0.1 10*3/uL (ref 0.0–0.5)
Eosinophils Relative: 2 %
HCT: 42.5 % (ref 36.0–46.0)
Hemoglobin: 14 g/dL (ref 12.0–15.0)
Immature Granulocytes: 0 %
Lymphocytes Relative: 31 %
Lymphs Abs: 1.6 10*3/uL (ref 0.7–4.0)
MCH: 28.5 pg (ref 26.0–34.0)
MCHC: 32.9 g/dL (ref 30.0–36.0)
MCV: 86.4 fL (ref 80.0–100.0)
Monocytes Absolute: 0.5 10*3/uL (ref 0.1–1.0)
Monocytes Relative: 9 %
Neutro Abs: 3 10*3/uL (ref 1.7–7.7)
Neutrophils Relative %: 57 %
Platelets: 263 10*3/uL (ref 150–400)
RBC: 4.92 MIL/uL (ref 3.87–5.11)
RDW: 13.4 % (ref 11.5–15.5)
WBC: 5.3 10*3/uL (ref 4.0–10.5)
nRBC: 0 % (ref 0.0–0.2)

## 2020-11-30 LAB — COMPREHENSIVE METABOLIC PANEL
ALT: 20 U/L (ref 0–44)
AST: 25 U/L (ref 15–41)
Albumin: 4.1 g/dL (ref 3.5–5.0)
Alkaline Phosphatase: 104 U/L (ref 38–126)
Anion gap: 13 (ref 5–15)
BUN: 8 mg/dL (ref 8–23)
CO2: 22 mmol/L (ref 22–32)
Calcium: 10.1 mg/dL (ref 8.9–10.3)
Chloride: 106 mmol/L (ref 98–111)
Creatinine, Ser: 0.8 mg/dL (ref 0.44–1.00)
GFR, Estimated: >60 mL/min (ref >60)
Glucose, Bld: 96 mg/dL (ref 70–99)
Potassium: 3.8 mmol/L (ref 3.5–5.1)
Sodium: 141 mmol/L (ref 135–145)
Total Bilirubin: 1.2 mg/dL (ref 0.3–1.2)
Total Protein: 7.3 g/dL (ref 6.5–8.1)

## 2020-11-30 LAB — SURGICAL PCR SCREEN
MRSA, PCR: NEGATIVE
Staphylococcus aureus: NEGATIVE

## 2020-11-30 LAB — APTT: aPTT: 32 seconds (ref 24–36)

## 2020-11-30 LAB — PROTIME-INR
INR: 1 (ref 0.8–1.2)
Prothrombin Time: 12.8 seconds (ref 11.4–15.2)

## 2020-11-30 SURGERY — OPEN REDUCTION INTERNAL FIXATION (ORIF) TIBIAL PLATEAU
Anesthesia: General | Site: Leg Lower | Laterality: Right

## 2020-11-30 MED ORDER — ACETAMINOPHEN 160 MG/5ML PO SOLN: 325.0000 mg | Freq: Once | ORAL | Status: DC | PRN

## 2020-11-30 MED ORDER — POVIDONE-IODINE 10 % EX SWAB
2.0000 "application " | Freq: Once | CUTANEOUS | Status: AC
  Administered 2020-11-30: 2 via TOPICAL

## 2020-11-30 MED ORDER — MORPHINE SULFATE (PF) 2 MG/ML IV SOLN
0.5000 mg | INTRAVENOUS | Status: DC | PRN
  Administered 2020-12-01: 1 mg via INTRAVENOUS
  Administered 2020-12-01: 0.5 mg via INTRAVENOUS
  Administered 2020-12-01: 1 mg via INTRAVENOUS
  Filled 2020-11-30 (×3): qty 1

## 2020-11-30 MED ORDER — FENTANYL CITRATE (PF) 250 MCG/5ML IJ SOLN
INTRAMUSCULAR | Status: DC | PRN
  Administered 2020-11-30 (×5): 50 ug via INTRAVENOUS

## 2020-11-30 MED ORDER — SCOPOLAMINE 1 MG/3DAYS TD PT72
1.0000 | MEDICATED_PATCH | TRANSDERMAL | Status: DC
  Administered 2020-11-30: 1.5 mg via TRANSDERMAL

## 2020-11-30 MED ORDER — METOCLOPRAMIDE HCL 5 MG PO TABS: 5.0000 mg | ORAL_TABLET | Freq: Three times a day (TID) | ORAL | Status: DC | PRN

## 2020-11-30 MED ORDER — SUCCINYLCHOLINE CHLORIDE 200 MG/10ML IV SOSY
PREFILLED_SYRINGE | INTRAVENOUS | Status: AC
  Filled 2020-11-30: qty 10

## 2020-11-30 MED ORDER — FENTANYL CITRATE (PF) 100 MCG/2ML IJ SOLN
INTRAMUSCULAR | Status: AC
  Filled 2020-11-30: qty 2

## 2020-11-30 MED ORDER — ROCURONIUM BROMIDE 10 MG/ML (PF) SYRINGE
PREFILLED_SYRINGE | INTRAVENOUS | Status: AC
  Filled 2020-11-30: qty 10

## 2020-11-30 MED ORDER — FENTANYL CITRATE (PF) 100 MCG/2ML IJ SOLN
25.0000 ug | INTRAMUSCULAR | Status: DC | PRN
  Administered 2020-11-30 (×3): 50 ug via INTRAVENOUS

## 2020-11-30 MED ORDER — GLYCOPYRROLATE 0.2 MG/ML IJ SOLN
INTRAMUSCULAR | Status: DC | PRN
  Administered 2020-11-30: .2 mg via INTRAVENOUS

## 2020-11-30 MED ORDER — PHENYLEPHRINE HCL-NACL 10-0.9 MG/250ML-% IV SOLN
INTRAVENOUS | Status: DC | PRN
  Administered 2020-11-30: 60 ug/min via INTRAVENOUS

## 2020-11-30 MED ORDER — 0.9 % SODIUM CHLORIDE (POUR BTL) OPTIME
TOPICAL | Status: DC | PRN
  Administered 2020-11-30: 1000 mL

## 2020-11-30 MED ORDER — CEFAZOLIN SODIUM-DEXTROSE 1-4 GM/50ML-% IV SOLN
1.0000 g | Freq: Four times a day (QID) | INTRAVENOUS | Status: AC
  Administered 2020-11-30 – 2020-12-01 (×3): 1 g via INTRAVENOUS
  Filled 2020-11-30 (×3): qty 50

## 2020-11-30 MED ORDER — PROPOFOL 10 MG/ML IV BOLUS
INTRAVENOUS | Status: AC
  Filled 2020-11-30: qty 20

## 2020-11-30 MED ORDER — EPHEDRINE 5 MG/ML INJ
INTRAVENOUS | Status: AC
  Filled 2020-11-30: qty 10

## 2020-11-30 MED ORDER — ACETAMINOPHEN 325 MG PO TABS
650.0000 mg | ORAL_TABLET | Freq: Four times a day (QID) | ORAL | Status: DC
  Administered 2020-12-01 – 2020-12-02 (×5): 650 mg via ORAL
  Filled 2020-11-30 (×5): qty 2

## 2020-11-30 MED ORDER — MEPERIDINE HCL 25 MG/ML IJ SOLN: 6.2500 mg | INTRAMUSCULAR | Status: DC | PRN

## 2020-11-30 MED ORDER — SCOPOLAMINE 1 MG/3DAYS TD PT72
MEDICATED_PATCH | TRANSDERMAL | Status: AC
  Filled 2020-11-30: qty 1

## 2020-11-30 MED ORDER — ADULT MULTIVITAMIN W/MINERALS CH
1.0000 | ORAL_TABLET | Freq: Every day | ORAL | Status: DC
  Administered 2020-11-30 – 2020-12-02 (×3): 1 via ORAL
  Filled 2020-11-30 (×3): qty 1

## 2020-11-30 MED ORDER — ENOXAPARIN SODIUM 40 MG/0.4ML ~~LOC~~ SOLN
40.0000 mg | SUBCUTANEOUS | Status: DC
  Administered 2020-12-01 – 2020-12-02 (×2): 40 mg via SUBCUTANEOUS
  Filled 2020-11-30 (×2): qty 0.4

## 2020-11-30 MED ORDER — TRAMADOL HCL 50 MG PO TABS
50.0000 mg | ORAL_TABLET | Freq: Four times a day (QID) | ORAL | Status: DC | PRN
  Administered 2020-11-30: 50 mg via ORAL
  Administered 2020-12-01: 100 mg via ORAL
  Filled 2020-11-30: qty 1
  Filled 2020-11-30: qty 2

## 2020-11-30 MED ORDER — LACTATED RINGERS IV SOLN: INTRAVENOUS | Status: DC | PRN

## 2020-11-30 MED ORDER — METHOCARBAMOL 1000 MG/10ML IJ SOLN
500.0000 mg | Freq: Four times a day (QID) | INTRAVENOUS | Status: DC | PRN
  Filled 2020-11-30 (×2): qty 5

## 2020-11-30 MED ORDER — ASCORBIC ACID 500 MG PO TABS
1000.0000 mg | ORAL_TABLET | Freq: Every day | ORAL | Status: DC
  Administered 2020-11-30 – 2020-12-02 (×3): 1000 mg via ORAL
  Filled 2020-11-30 (×3): qty 2

## 2020-11-30 MED ORDER — CEFAZOLIN SODIUM-DEXTROSE 2-4 GM/100ML-% IV SOLN
2.0000 g | INTRAVENOUS | Status: AC
  Administered 2020-11-30: 2 g via INTRAVENOUS
  Filled 2020-11-30: qty 100

## 2020-11-30 MED ORDER — MIDAZOLAM HCL 2 MG/2ML IJ SOLN
INTRAMUSCULAR | Status: AC
  Filled 2020-11-30: qty 2

## 2020-11-30 MED ORDER — LIDOCAINE 2% (20 MG/ML) 5 ML SYRINGE
INTRAMUSCULAR | Status: DC | PRN
  Administered 2020-11-30: 40 mg via INTRAVENOUS

## 2020-11-30 MED ORDER — ACETAMINOPHEN 10 MG/ML IV SOLN
1000.0000 mg | Freq: Once | INTRAVENOUS | Status: DC | PRN
  Administered 2020-11-30: 1000 mg via INTRAVENOUS

## 2020-11-30 MED ORDER — PROPOFOL 10 MG/ML IV BOLUS
INTRAVENOUS | Status: DC | PRN
  Administered 2020-11-30: 130 mg via INTRAVENOUS

## 2020-11-30 MED ORDER — ACETAMINOPHEN 500 MG PO TABS
1000.0000 mg | ORAL_TABLET | Freq: Once | ORAL | Status: AC
  Administered 2020-11-30: 1000 mg via ORAL
  Filled 2020-11-30: qty 2

## 2020-11-30 MED ORDER — ONDANSETRON HCL 4 MG/2ML IJ SOLN: 4.0000 mg | Freq: Four times a day (QID) | INTRAMUSCULAR | Status: DC | PRN

## 2020-11-30 MED ORDER — VITAMIN D 25 MCG (1000 UNIT) PO TABS
2000.0000 [IU] | ORAL_TABLET | Freq: Two times a day (BID) | ORAL | Status: DC
  Administered 2020-11-30 – 2020-12-02 (×4): 2000 [IU] via ORAL
  Filled 2020-11-30 (×4): qty 2

## 2020-11-30 MED ORDER — DEXAMETHASONE SODIUM PHOSPHATE 10 MG/ML IJ SOLN
INTRAMUSCULAR | Status: DC | PRN
  Administered 2020-11-30: 5 mg via INTRAVENOUS

## 2020-11-30 MED ORDER — ONDANSETRON HCL 4 MG PO TABS: 4.0000 mg | ORAL_TABLET | Freq: Four times a day (QID) | ORAL | Status: DC | PRN

## 2020-11-30 MED ORDER — GLYCOPYRROLATE PF 0.2 MG/ML IJ SOSY
PREFILLED_SYRINGE | INTRAMUSCULAR | Status: AC
  Filled 2020-11-30: qty 1

## 2020-11-30 MED ORDER — POLYETHYLENE GLYCOL 3350 17 G PO PACK
17.0000 g | PACK | Freq: Every day | ORAL | Status: DC
  Administered 2020-11-30: 17 g via ORAL
  Filled 2020-11-30 (×2): qty 1

## 2020-11-30 MED ORDER — SUGAMMADEX SODIUM 200 MG/2ML IV SOLN
INTRAVENOUS | Status: DC | PRN
  Administered 2020-11-30: 200 mg via INTRAVENOUS

## 2020-11-30 MED ORDER — LACTATED RINGERS IV SOLN: INTRAVENOUS | Status: DC

## 2020-11-30 MED ORDER — ACETAMINOPHEN 325 MG PO TABS: 325.0000 mg | ORAL_TABLET | Freq: Once | ORAL | Status: DC | PRN

## 2020-11-30 MED ORDER — CHLORHEXIDINE GLUCONATE 0.12 % MT SOLN
OROMUCOSAL | Status: AC
  Administered 2020-11-30: 15 mL
  Filled 2020-11-30: qty 15

## 2020-11-30 MED ORDER — PANTOPRAZOLE SODIUM 40 MG PO TBEC
40.0000 mg | DELAYED_RELEASE_TABLET | Freq: Every day | ORAL | Status: DC
  Administered 2020-12-01 – 2020-12-02 (×2): 40 mg via ORAL
  Filled 2020-11-30 (×3): qty 1

## 2020-11-30 MED ORDER — EPHEDRINE SULFATE-NACL 50-0.9 MG/10ML-% IV SOSY
PREFILLED_SYRINGE | INTRAVENOUS | Status: DC | PRN
  Administered 2020-11-30 (×4): 10 mg via INTRAVENOUS

## 2020-11-30 MED ORDER — ROCURONIUM BROMIDE 10 MG/ML (PF) SYRINGE
PREFILLED_SYRINGE | INTRAVENOUS | Status: DC | PRN
  Administered 2020-11-30: 30 mg via INTRAVENOUS
  Administered 2020-11-30: 20 mg via INTRAVENOUS
  Administered 2020-11-30: 70 mg via INTRAVENOUS

## 2020-11-30 MED ORDER — METOCLOPRAMIDE HCL 5 MG/ML IJ SOLN: 5.0000 mg | Freq: Three times a day (TID) | INTRAMUSCULAR | Status: DC | PRN

## 2020-11-30 MED ORDER — ACETAMINOPHEN 10 MG/ML IV SOLN
INTRAVENOUS | Status: AC
  Filled 2020-11-30: qty 100

## 2020-11-30 MED ORDER — MIDAZOLAM HCL 2 MG/2ML IJ SOLN
INTRAMUSCULAR | Status: DC | PRN
  Administered 2020-11-30 (×2): 1 mg via INTRAVENOUS

## 2020-11-30 MED ORDER — DEXAMETHASONE SODIUM PHOSPHATE 10 MG/ML IJ SOLN
INTRAMUSCULAR | Status: AC
  Filled 2020-11-30: qty 1

## 2020-11-30 MED ORDER — AMISULPRIDE (ANTIEMETIC) 5 MG/2ML IV SOLN: 10.0000 mg | Freq: Once | INTRAVENOUS | Status: DC | PRN

## 2020-11-30 MED ORDER — POTASSIUM CHLORIDE IN NACL 20-0.9 MEQ/L-% IV SOLN
INTRAVENOUS | Status: DC
  Filled 2020-11-30 (×2): qty 1000

## 2020-11-30 MED ORDER — FENTANYL CITRATE (PF) 250 MCG/5ML IJ SOLN
INTRAMUSCULAR | Status: AC
  Filled 2020-11-30: qty 5

## 2020-11-30 MED ORDER — CHLORHEXIDINE GLUCONATE 4 % EX LIQD: 60.0000 mL | Freq: Once | CUTANEOUS | Status: DC

## 2020-11-30 MED ORDER — ONDANSETRON HCL 4 MG/2ML IJ SOLN
INTRAMUSCULAR | Status: AC
  Filled 2020-11-30: qty 2

## 2020-11-30 MED ORDER — LIDOCAINE HCL (PF) 2 % IJ SOLN
INTRAMUSCULAR | Status: AC
  Filled 2020-11-30: qty 5

## 2020-11-30 MED ORDER — ONDANSETRON HCL 4 MG/2ML IJ SOLN
INTRAMUSCULAR | Status: DC | PRN
  Administered 2020-11-30: 4 mg via INTRAVENOUS

## 2020-11-30 MED ORDER — VITAMIN D (ERGOCALCIFEROL) 1.25 MG (50000 UNIT) PO CAPS
50000.0000 [IU] | ORAL_CAPSULE | ORAL | Status: DC
  Filled 2020-11-30 (×2): qty 1

## 2020-11-30 MED ORDER — DOCUSATE SODIUM 100 MG PO CAPS
100.0000 mg | ORAL_CAPSULE | Freq: Two times a day (BID) | ORAL | Status: DC
  Administered 2020-11-30 – 2020-12-01 (×3): 100 mg via ORAL
  Filled 2020-11-30 (×4): qty 1

## 2020-11-30 MED ORDER — PHENYLEPHRINE 40 MCG/ML (10ML) SYRINGE FOR IV PUSH (FOR BLOOD PRESSURE SUPPORT)
PREFILLED_SYRINGE | INTRAVENOUS | Status: DC | PRN
  Administered 2020-11-30: 80 ug via INTRAVENOUS

## 2020-11-30 MED ORDER — METHOCARBAMOL 500 MG PO TABS
500.0000 mg | ORAL_TABLET | Freq: Four times a day (QID) | ORAL | Status: DC | PRN
  Administered 2020-11-30: 500 mg via ORAL
  Filled 2020-11-30: qty 1

## 2020-11-30 MED ORDER — PHENYLEPHRINE 40 MCG/ML (10ML) SYRINGE FOR IV PUSH (FOR BLOOD PRESSURE SUPPORT)
PREFILLED_SYRINGE | INTRAVENOUS | Status: AC
  Filled 2020-11-30: qty 10

## 2020-11-30 MED ORDER — ALBUMIN HUMAN 5 % IV SOLN: INTRAVENOUS | Status: DC | PRN

## 2020-11-30 SURGICAL SUPPLY — 97 items
BANDAGE ESMARK 6X9 LF (GAUZE/BANDAGES/DRESSINGS) ×2 IMPLANT
BIT DRILL 100X2.5XANTM LCK (BIT) ×2 IMPLANT
BIT DRILL 2.5X2.75 QC CALB (BIT) ×3 IMPLANT
BIT DRILL CAL (BIT) ×2 IMPLANT
BIT DRL 100X2.5XANTM LCK (BIT) ×2
BLADE SURG 10 STRL SS (BLADE) ×3 IMPLANT
BLADE SURG 15 STRL LF DISP TIS (BLADE) ×2 IMPLANT
BLADE SURG 15 STRL SS (BLADE) ×1
BNDG COHESIVE 4X5 TAN STRL (GAUZE/BANDAGES/DRESSINGS) ×3 IMPLANT
BNDG ELASTIC 4X5.8 VLCR STR LF (GAUZE/BANDAGES/DRESSINGS) ×3 IMPLANT
BNDG ELASTIC 6X5.8 VLCR STR LF (GAUZE/BANDAGES/DRESSINGS) ×3 IMPLANT
BNDG ESMARK 6X9 LF (GAUZE/BANDAGES/DRESSINGS) ×3
BNDG GAUZE ELAST 4 BULKY (GAUZE/BANDAGES/DRESSINGS) ×3 IMPLANT
BONE CANC CHIPS 40CC CAN1/2 (Bone Implant) ×6 IMPLANT
BRUSH SCRUB EZ PLAIN DRY (MISCELLANEOUS) ×6 IMPLANT
CANISTER SUCT 3000ML PPV (MISCELLANEOUS) ×3 IMPLANT
CANISTER WOUND CARE 500ML ATS (WOUND CARE) IMPLANT
CHIPS CANC BONE 40CC CAN1/2 (Bone Implant) ×4 IMPLANT
COVER SURGICAL LIGHT HANDLE (MISCELLANEOUS) ×6 IMPLANT
COVER WAND RF STERILE (DRAPES) ×3 IMPLANT
CUFF TOURN SGL QUICK 34 (TOURNIQUET CUFF) ×1
CUFF TRNQT CYL 34X4.125X (TOURNIQUET CUFF) ×2 IMPLANT
DRAPE C-ARM 42X72 X-RAY (DRAPES) ×3 IMPLANT
DRAPE C-ARMOR (DRAPES) ×3 IMPLANT
DRAPE HALF SHEET 40X57 (DRAPES) IMPLANT
DRAPE INCISE IOBAN 66X45 STRL (DRAPES) ×3 IMPLANT
DRAPE ORTHO SPLIT 77X108 STRL (DRAPES) ×2
DRAPE SURG ORHT 6 SPLT 77X108 (DRAPES) ×4 IMPLANT
DRAPE U-SHAPE 47X51 STRL (DRAPES) ×3 IMPLANT
DRILL BIT 2.5MM (BIT) ×1
DRILL BIT CAL (BIT) ×3
DRSG ADAPTIC 3X8 NADH LF (GAUZE/BANDAGES/DRESSINGS) ×3 IMPLANT
DRSG PAD ABDOMINAL 8X10 ST (GAUZE/BANDAGES/DRESSINGS) ×6 IMPLANT
DRSG VAC ATS LRG SENSATRAC (GAUZE/BANDAGES/DRESSINGS) IMPLANT
DRSG VAC ATS MED SENSATRAC (GAUZE/BANDAGES/DRESSINGS) IMPLANT
DRSG VAC ATS SM SENSATRAC (GAUZE/BANDAGES/DRESSINGS) IMPLANT
ELECT REM PT RETURN 9FT ADLT (ELECTROSURGICAL) ×3
ELECTRODE REM PT RTRN 9FT ADLT (ELECTROSURGICAL) ×2 IMPLANT
GAUZE SPONGE 4X4 12PLY STRL (GAUZE/BANDAGES/DRESSINGS) ×3 IMPLANT
GLOVE BIO SURGEON STRL SZ7.5 (GLOVE) ×3 IMPLANT
GLOVE BIO SURGEON STRL SZ8 (GLOVE) ×3 IMPLANT
GLOVE BIOGEL PI IND STRL 7.5 (GLOVE) ×2 IMPLANT
GLOVE BIOGEL PI IND STRL 8 (GLOVE) ×2 IMPLANT
GLOVE BIOGEL PI INDICATOR 7.5 (GLOVE) ×1
GLOVE BIOGEL PI INDICATOR 8 (GLOVE) ×1
GOWN STRL REUS W/ TWL LRG LVL3 (GOWN DISPOSABLE) ×4 IMPLANT
GOWN STRL REUS W/ TWL XL LVL3 (GOWN DISPOSABLE) ×2 IMPLANT
GOWN STRL REUS W/TWL LRG LVL3 (GOWN DISPOSABLE) ×2
GOWN STRL REUS W/TWL XL LVL3 (GOWN DISPOSABLE) ×1
IMMOBILIZER KNEE 22 UNIV (SOFTGOODS) ×3 IMPLANT
K-WIRE ACE 1.6X6 (WIRE) ×3
KIT BASIN OR (CUSTOM PROCEDURE TRAY) ×3 IMPLANT
KIT TURNOVER KIT B (KITS) ×3 IMPLANT
KWIRE ACE 1.6X6 (WIRE) ×2 IMPLANT
MANIFOLD NEPTUNE II (INSTRUMENTS) ×3 IMPLANT
NDL SUT 6 .5 CRC .975X.05 MAYO (NEEDLE) ×2 IMPLANT
NEEDLE MAYO TAPER (NEEDLE) ×1
NS IRRIG 1000ML POUR BTL (IV SOLUTION) ×3 IMPLANT
PACK ORTHO EXTREMITY (CUSTOM PROCEDURE TRAY) ×3 IMPLANT
PAD ABD 8X10 STRL (GAUZE/BANDAGES/DRESSINGS) ×3 IMPLANT
PAD ARMBOARD 7.5X6 YLW CONV (MISCELLANEOUS) ×6 IMPLANT
PAD CAST 4YDX4 CTTN HI CHSV (CAST SUPPLIES) ×2 IMPLANT
PADDING CAST COTTON 4X4 STRL (CAST SUPPLIES) ×1
PADDING CAST COTTON 6X4 STRL (CAST SUPPLIES) ×3 IMPLANT
PLATE LOCK 5H STD RT PROX TIB (Plate) ×3 IMPLANT
SCREW CORTICAL 3.5MM  34MM (Screw) ×1 IMPLANT
SCREW CORTICAL 3.5MM  42MM (Screw) ×1 IMPLANT
SCREW CORTICAL 3.5MM 34MM (Screw) ×2 IMPLANT
SCREW CORTICAL 3.5MM 38MM (Screw) ×3 IMPLANT
SCREW CORTICAL 3.5MM 42MM (Screw) ×2 IMPLANT
SCREW LOCK CORT STAR 3.5X65 (Screw) ×3 IMPLANT
SCREW LOCK CORT STAR 3.5X70 (Screw) ×3 IMPLANT
SCREW LOCK CORT STAR 3.5X75 (Screw) ×9 IMPLANT
SCREW LP 3.5X70MM (Screw) ×3 IMPLANT
SCREW LP 3.5X75MM (Screw) ×3 IMPLANT
SET MONITOR QUICK PRESSURE (MISCELLANEOUS) IMPLANT
SPONGE LAP 18X18 RF (DISPOSABLE) ×3 IMPLANT
STAPLER VISISTAT 35W (STAPLE) ×3 IMPLANT
STOCKINETTE IMPERVIOUS 9X36 MD (GAUZE/BANDAGES/DRESSINGS) ×3 IMPLANT
STOCKINETTE IMPERVIOUS LG (DRAPES) ×3 IMPLANT
SUCTION FRAZIER HANDLE 10FR (MISCELLANEOUS) ×1
SUCTION TUBE FRAZIER 10FR DISP (MISCELLANEOUS) ×2 IMPLANT
SUT ETHILON 3 0 PS 1 (SUTURE) ×3 IMPLANT
SUT PROLENE 0 CT 2 (SUTURE) ×6 IMPLANT
SUT VIC AB 0 CT1 27 (SUTURE) ×1
SUT VIC AB 0 CT1 27XBRD ANBCTR (SUTURE) ×2 IMPLANT
SUT VIC AB 1 CT1 27 (SUTURE) ×1
SUT VIC AB 1 CT1 27XBRD ANBCTR (SUTURE) ×2 IMPLANT
SUT VIC AB 2-0 CT1 27 (SUTURE) ×1
SUT VIC AB 2-0 CT1 TAPERPNT 27 (SUTURE) ×2 IMPLANT
SYR 5ML LUER SLIP (SYRINGE) ×9 IMPLANT
TOWEL GREEN STERILE (TOWEL DISPOSABLE) ×6 IMPLANT
TOWEL GREEN STERILE FF (TOWEL DISPOSABLE) ×3 IMPLANT
TRAY FOLEY MTR SLVR 16FR STAT (SET/KITS/TRAYS/PACK) ×3 IMPLANT
TUBE CONNECTING 12X1/4 (SUCTIONS) ×3 IMPLANT
WATER STERILE IRR 1000ML POUR (IV SOLUTION) ×3 IMPLANT
YANKAUER SUCT BULB TIP NO VENT (SUCTIONS) ×3 IMPLANT

## 2020-11-30 NOTE — Op Note (Signed)
NAME: Samantha Gray MEDICAL RECORD [id number] DATE OF BIRTH: [birth year] FACILITY: [facility] LOCATION: [facility] PHYSICIAN:Witney Huie H. Samaiyah Howes, MD  OPERATIVE REPORT  DATE OF PROCEDURE:  11/30/2020  PREOPERATIVE DIAGNOSIS:   1. RIGHT DEPRESSED LATERAL TIBIAL PLATEAU FRACTURE.  2. RIGHT TIBIAL EMINENCE FRACTURE 3. SUSPECTED RIGHT LATERAL MENISCUS TEAR  POSTOPERATIVE DIAGNOSES:   1. RIGHT DEPRESSED LATERAL TIBIAL PLATEAU FRACTURE.  2. RIGHT TIBIAL EMINENCE FRACTURE 3. RIGHT LATERAL MENISCUS AVULSION PERIPHERAL TEAR  PROCEDURES: 1.  Open reduction internal fixation of right lateral tibial plateau. 2.  Arthrotomy with repair of lateral meniscus tear. 3.  Open reduction internal fixation of right tibial spine/ eminence  4.  Anterior compartment fasciotomy. 5.  Manual application of stress under fluoro to the right knee.  SURGEON:  Myrene Galas, MD  ASSISTANT:  Montez Morita, PA-C  ANESTHESIA:  General.  COMPLICATIONS:  None.  TOURNIQUET:  None.  ESTIMATED BLOOD LOSS:  100 mL.  SPECIMENS:  None.  DRAINS:  None.  DISPOSITION:  To PACU.  CONDITION:  Stable.  BRIEF SUMMARY AND INDICATIONS FOR PROCEDURE:  The patient is a very pleasant 64 y.o. who sustained tibial plateau fracture in fall resulting in swelling, pain, inability to bear weight.  Subsequent x-rays and CT scan demonstrated a lateral tibial plateau depression and was also associated with clinical instability in full extension on physical examination.  I discussed with the  patient risks and benefits of surgical repair, including the possibility of infection, nerve injury, vessel injury, DVT, PE, particularly given his medical history, as well as malunion, nonunion, symptomatic hardware, heart attack, stroke and other complications.  After acknowledging these risks, the patient provided consent to proceed.  BRIEF SUMMARY OF PROCEDURE:  The patient was taken to the operating room where general anesthesia was induced.  The  operative lower extremity was prepped and draped in the usual sterile fashion with chlorhexidine wash, then Betadine scrub and paint.  I made a curvilinear incision over Samantha Gray tubercle.  The retinaculum was incised proximal to the joint and then the coronary ligament incised along its base and the knee swung into varus to open up the lateral compartment for visibility. The lateral meniscus was found to be torn entirely from the lateral capsule. Prolene sutures were passed in vertical mattress technique through the retinaculum and the edge of the lateral meniscus and retracted upward. This revealed significant depression, particularly anteriorly and laterally.  I then went to the metaphysis making a trap door using the curved 1/2-inch osteotome with a posteriorly based hinge.  This was opened up and the bone tamp used in sequential fashion supplemented with both fluoroscopy and direct visualization to get the entirety of the joint elevated to the appropriate height, beginning with the lateral portion of the tibial spine. The spine sigment was then pinched in with adjacent lateral plateau articular segments. This was secured initially with multiple K wires and then the Biomet ALPS plate applied laterally.  Standard screws were used initially in the top level in the most anterior and posterior holes and then lock fixation was used for the remainder.  One distal standard screw was placed at the distal edge of the plate prior to beginning placement of any of the locked fixation.  AP and lateral views showed outstanding reduction and overall knee alignment.  It should be noted that prior to closing the trap door 60 mL of cancellous chips was impacted into this area below the subchondral bone, which had been elevated into a reduced position and then the trap  door was closed before putting down the lateral plate.  Lastly, the long Metzenbaum scissors were used to spread superficial and deep to the anterior compartment  fascia and then the scissors were passed 10 cm along the fascia to perform an anterior compartment release to reduce the risk of postoperative compartment syndrome or other complications.  All wounds were irrigated thoroughly and then closed in standard layered fashion using 0 Prolene vertical mattress for coronary multi-ligament repair, #1 Vicryl for the retinaculum, 2-0 Vicryl and 2-0 nylon for the subcutaneous and skin.  Sterile gently compressive dressing was applied and a knee immobilizer.  The patient was awakened from anesthesia and transported to the PACU in stable condition.  Montez Morita, PA-C, was present and assisting throughout.  Assistant was absolutely necessary to control the leg for inspection, treatment of a meniscal tear, as well as reduction and provisional definitive fixation of the plateau.  He also assisted with the compartment fasciotomy and closure. I did perform manual application of stress to the knee under fluoro to make sure there was no ligamentous instability. None was identified.  PROGNOSIS:  The patient will have unrestricted range of motion, but will be strictly nonweightbearing for the next 6 weeks with graduated weightbearing thereafter.  DVT prophylaxis with Lovenox. Ice, elevate, contact us immediately with any concerns.  Return to the office in 2 weeks for removal of his sutures.

## 2020-11-30 NOTE — Progress Notes (Signed)
1858- Pt arrived in the unit to 5N07 from PACU, alert and oriented x4, right leg with long ace wrap and ice pack. Pt with a foley. VS taken

## 2020-11-30 NOTE — Anesthesia Procedure Notes (Signed)
Procedure Name: Intubation Date/Time: 11/30/2020 2:53 PM Performed by: Reece Agar, CRNA Pre-anesthesia Checklist: Patient identified, Emergency Drugs available, Suction available and Patient being monitored Patient Re-evaluated:Patient Re-evaluated prior to induction Oxygen Delivery Method: Circle System Utilized Preoxygenation: Pre-oxygenation with 100% oxygen Induction Type: IV induction Ventilation: Mask ventilation without difficulty Laryngoscope Size: Mac and 3 Grade View: Grade II Tube type: Oral Tube size: 7.0 mm Number of attempts: 1 Airway Equipment and Method: Stylet and Oral airway Placement Confirmation: ETT inserted through vocal cords under direct vision,  positive ETCO2 and breath sounds checked- equal and bilateral Secured at: 21 cm Tube secured with: Tape Dental Injury: Teeth and Oropharynx as per pre-operative assessment

## 2020-11-30 NOTE — Transfer of Care (Signed)
Immediate Anesthesia Transfer of Care Note  Patient: Samantha Gray  Procedure(s) Performed: OPEN REDUCTION INTERNAL FIXATION (ORIF) TIBIAL PLATEAU (Right Leg Lower) OPEN REPAIR OF MENISCUS (Right Knee) ANTERIOR COMPARTMENT FASCIOTOMY (Right Leg Lower)  Patient Location: PACU  Anesthesia Type:General  Level of Consciousness: awake and alert   Airway & Oxygen Therapy: Patient Spontanous Breathing and Patient connected to face mask oxygen  Post-op Assessment: Report given to RN and Post -op Vital signs reviewed and stable  Post vital signs: Reviewed and stable  Last Vitals:  Vitals Value Taken Time  BP 136/66 11/30/20 1751  Temp 36.4 C 11/30/20 1751  Pulse 63 11/30/20 1756  Resp 12 11/30/20 1755  SpO2 99 % 11/30/20 1756  Vitals shown include unvalidated device data.  Last Pain:  Vitals:   11/30/20 1751  TempSrc:   PainSc: 7       Patients Stated Pain Goal: 3 (11/30/20 1126)  Complications: No complications documented.

## 2020-11-30 NOTE — H&P (Signed)
Orthopaedic Trauma Service (OTS) Consult   [name] MRN: [id number] DOB/AGE: [birth year] 64 y.o.   HPI: Samantha Gray is an 64 y.o. female who was referred to the orthopedic trauma service for evaluation of a right tibial plateau fracture.  Patient sustained the injury on 11/20/2020 when her dog got tangled up in between her legs causing her to fall.  Patient was initially seen and evaluated at [facility] urgent care and then followed up with Dr. Odis Luster in [place].  Due to the complexity of the injury she was referred to the orthopedic trauma service.  Patient was seen and evaluated at our office on 11/27/2020.  X-rays and CT scan were reviewed and surgical plan reviewed with the patient.  Patient presents today for ORIF of her right lateral tibial plateau fracture.  Please see office note at the bottom of this note.   Past Medical History:  Diagnosis Date  . GERD (gastroesophageal reflux disease)   . Hyperlipidemia     Past Surgical History:  Procedure Laterality Date  . CESAREAN SECTION    . KNEE SURGERY      Family History  Problem Relation Age of Onset  . Heart attack Father 58  . Heart attack Brother 39  . Hypertension Brother   . Hyperlipidemia Brother   . Heart attack Brother   . Hyperlipidemia Brother   . Hypertension Brother   . Heart disease Brother        stents     Social History:  reports that she has never smoked. She has never used smokeless tobacco. She reports that she does not drink alcohol and does not use drugs.  Allergies:  Allergies  Allergen Reactions  . Codeine Nausea And Vomiting  . Hydrocodone-Acetaminophen Other (See Comments)    Bed moving and voices slow  . Oxycodone-Acetaminophen   . Prednisone Swelling    Medications: I have reviewed the patient's current medications. Current Meds  Medication Sig  . acetaminophen (TYLENOL) 500 MG tablet Take 1,000 mg by mouth every 6 (six) hours as needed for mild  pain or moderate pain.  . fluticasone (FLONASE) 50 MCG/ACT nasal spray Place 2 sprays into both nostrils daily as needed for allergies or rhinitis.   Marland Kitchen loratadine-pseudoephedrine (CLARITIN-D 12-HOUR) 5-120 MG tablet Take 1 tablet by mouth daily as needed for allergies.  . meloxicam (MOBIC) 15 MG tablet Take 15 mg by mouth daily.  . methocarbamol (ROBAXIN) 500 MG tablet Take 1 tablet (500 mg total) by mouth every 6 (six) hours as needed. (Patient taking differently: Take 500 mg by mouth every 6 (six) hours as needed for muscle spasms.)  . pantoprazole (PROTONIX) 40 MG tablet Take 40 mg by mouth daily.  . rosuvastatin (CRESTOR) 10 MG tablet TAKE 1 TABLET BY MOUTH  DAILY (Patient taking differently: Take 10 mg by mouth daily.)  . traMADol (ULTRAM) 50 MG tablet Take 1 tablet (50 mg total) by mouth every 6 (six) hours as needed. (Patient taking differently: Take 50 mg by mouth every 6 (six) hours as needed for moderate pain.)  . valACYclovir (VALTREX) 500 MG tablet Take 500 mg by mouth daily as needed (fever Blister).     No results found for this or any previous visit (from the past 48 hour(s)).  No results found.  Intake/Output    None      Review of Systems  Constitutional: Negative for chills and fever.  Eyes: Negative for blurred vision and double vision.  Respiratory: Negative for shortness of breath and wheezing.   Cardiovascular: Negative for chest pain and palpitations.  Gastrointestinal: Negative for abdominal pain, nausea and vomiting.  Musculoskeletal:       Right knee pain  Neurological: Negative for tingling and sensory change.   Blood pressure (!) 152/98, pulse 79, temperature 98.2 F (36.8 C), temperature source Oral, resp. rate 18, height 5\' 5"  (1.651 m), weight 78 kg, SpO2 99 %. Physical Exam Constitutional:      General: She is not in acute distress.    Appearance: Normal appearance. She is normal weight. She is not ill-appearing.  HENT:     Head: Normocephalic and  atraumatic.     Mouth/Throat:     Mouth: Mucous membranes are moist.     Pharynx: Oropharynx is clear.  Eyes:     Extraocular Movements: Extraocular movements intact.  Cardiovascular:     Rate and Rhythm: Normal rate and regular rhythm.     Heart sounds: No murmur heard.   Pulmonary:     Effort: Pulmonary effort is normal. No respiratory distress.     Breath sounds: Normal breath sounds.  Abdominal:     General: Bowel sounds are normal. There is no distension.  Musculoskeletal:     Cervical back: Normal range of motion.     Comments: Right lower extremity Swelling is controlled to the right knee, skin wrinkles with gentle compression along the lateral aspect of her knee.  Mild effusion appreciated Resolving ecchymosis down the lower leg noted. Nontender to her ankle.  She is tender over the lateral tibial plateau and joint line.  No medial sided tenderness.  No tenderness palpation of her distal femur or her patella.  Hip is unremarkable with evaluation. No deep calf tenderness. DPN, SPN, TN sensory functions intact Ankle flexion, extension, inversion and eversion are intact EHL, FHL, lesser toe motor functions intact Compartments are soft and nontender.  No pain with passive stretching  Skin:    General: Skin is warm.     Capillary Refill: Capillary refill takes less than 2 seconds.  Neurological:     Mental Status: She is alert and oriented to person, place, and time.  Psychiatric:        Attention and Perception: Attention normal.        Mood and Affect: Mood and affect normal.      Assessment/Plan:  64 year old female closed right lateral tibial plateau fracture with substantial depression  -Schatzker 3 right tibial plateau fracture  OR for ORIF  Nonweightbearing x6 weeks  Unrestricted range of motion postop  Admit postop for pain control and therapy  PT and OT evaluations postop  Aggressive ice and elevation postop for swelling and pain control  - Pain  management:  Titrate accordingly postoperatively  - Medical issues   Resume home meds postop  - DVT/PE prophylaxis:  Lovenox x30 days  SCDs  Mobilize  - ID:   Perioperative antibiotics  - Metabolic Bone Disease:  Check basic bone health labs including 25 OH vitamin D  - Activity:  Therapy evaluations postop, nonweightbearing right leg x 6 weeks  - FEN/GI prophylaxis/Foley/Lines:  Advance diet postop  Npo for now   - Impediments to fracture healing:  Low-energy fracture, ?  Osteoporosis  - Dispo:  OR for ORIF right tibial plateau     77, PA-C [phone] (C) 11/30/2020, 11:21 AM  [facility] [address] [place] [phone] (F)    After  5pm and on the weekends please log on to Amion, go to orthopaedics and the look under the [facility] Call for the provider(s) on call. You can also call our office at [phone] and then follow the prompts to be connected to the call team.

## 2020-11-30 NOTE — Anesthesia Preprocedure Evaluation (Addendum)
Anesthesia Evaluation  Patient identified by MRN, date of birth, ID band Patient awake    Reviewed: Allergy & Precautions, NPO status , Patient's Chart, lab work & pertinent test results  History of Anesthesia Complications (+) PONV and history of anesthetic complications  Airway Mallampati: II  TM Distance: >3 FB Neck ROM: Full    Dental  (+) Teeth Intact, Dental Advisory Given   Pulmonary neg pulmonary ROS,    breath sounds clear to auscultation       Cardiovascular  Rhythm:Regular Rate:Normal     Neuro/Psych negative neurological ROS  negative psych ROS   GI/Hepatic Neg liver ROS, GERD  ,  Endo/Other  negative endocrine ROS  Renal/GU negative Renal ROS     Musculoskeletal  (+) Arthritis ,   Abdominal Normal abdominal exam  (+)   Peds  Hematology negative hematology ROS (+)   Anesthesia Other Findings   Reproductive/Obstetrics                            Anesthesia Physical Anesthesia Plan  ASA: II  Anesthesia Plan: General   Post-op Pain Management:    Induction: Intravenous  PONV Risk Score and Plan: 4 or greater and Ondansetron, Dexamethasone, Midazolam and Scopolamine patch - Pre-op  Airway Management Planned: Oral ETT  Additional Equipment: None  Intra-op Plan:   Post-operative Plan: Extubation in OR  Informed Consent: I have reviewed the patients History and Physical, chart, labs and discussed the procedure including the risks, benefits and alternatives for the proposed anesthesia with the patient or authorized representative who has indicated his/her understanding and acceptance.     Dental advisory given  Plan Discussed with: CRNA  Anesthesia Plan Comments: (Lab Results      Component                Value               Date                      WBC                      5.3                 11/30/2020                HGB                      14.0                 11/30/2020                HCT                      42.5                11/30/2020                MCV                      86.4                11/30/2020                PLT                      263  11/30/2020            Lab Results      Component                Value               Date                      CREATININE               0.80                11/30/2020                BUN                      8                   11/30/2020                NA                       141                 11/30/2020                K                        3.8                 11/30/2020                CL                       106                 11/30/2020                CO2                      22                  11/30/2020            EKG: normal sinus rhythm. )       Anesthesia Quick Evaluation

## 2020-11-30 NOTE — Progress Notes (Signed)
Orthopedic Tech Progress Note Patient Details:  Samantha Gray Feb 29, 1956 [id number]  Ortho Devices Type of Ortho Device: Bone foam zero knee Ortho Device/Splint Location: delivered to room Ortho Device/Splint Interventions: Rich Brave 11/30/2020, 10:05 PM

## 2020-12-01 ENCOUNTER — Encounter (HOSPITAL_COMMUNITY): Payer: Self-pay | Admitting: Orthopedic Surgery

## 2020-12-01 DIAGNOSIS — K219 Gastro-esophageal reflux disease without esophagitis: Secondary | ICD-10-CM | POA: Diagnosis present

## 2020-12-01 DIAGNOSIS — E785 Hyperlipidemia, unspecified: Secondary | ICD-10-CM | POA: Diagnosis present

## 2020-12-01 LAB — CBC
HCT: 30.1 % — ABNORMAL LOW (ref 36.0–46.0)
Hemoglobin: 9.7 g/dL — ABNORMAL LOW (ref 12.0–15.0)
MCH: 28.2 pg (ref 26.0–34.0)
MCHC: 32.2 g/dL (ref 30.0–36.0)
MCV: 87.5 fL (ref 80.0–100.0)
Platelets: 215 10*3/uL (ref 150–400)
RBC: 3.44 MIL/uL — ABNORMAL LOW (ref 3.87–5.11)
RDW: 13.3 % (ref 11.5–15.5)
WBC: 8.4 10*3/uL (ref 4.0–10.5)
nRBC: 0 % (ref 0.0–0.2)

## 2020-12-01 LAB — BASIC METABOLIC PANEL
Anion gap: 10 (ref 5–15)
BUN: 7 mg/dL — ABNORMAL LOW (ref 8–23)
CO2: 23 mmol/L (ref 22–32)
Calcium: 9.2 mg/dL (ref 8.9–10.3)
Chloride: 105 mmol/L (ref 98–111)
Creatinine, Ser: 0.76 mg/dL (ref 0.44–1.00)
GFR, Estimated: >60 mL/min (ref >60)
Glucose, Bld: 131 mg/dL — ABNORMAL HIGH (ref 70–99)
Potassium: 4.4 mmol/L (ref 3.5–5.1)
Sodium: 138 mmol/L (ref 135–145)

## 2020-12-01 LAB — PREALBUMIN: Prealbumin: 24.1 mg/dL (ref 18–38)

## 2020-12-01 MED ORDER — KETOROLAC TROMETHAMINE 15 MG/ML IJ SOLN
15.0000 mg | Freq: Three times a day (TID) | INTRAMUSCULAR | Status: DC
  Administered 2020-12-01 – 2020-12-02 (×3): 15 mg via INTRAVENOUS
  Filled 2020-12-01 (×3): qty 1

## 2020-12-01 MED ORDER — DOCUSATE SODIUM 100 MG PO CAPS: 100.0000 mg | ORAL_CAPSULE | Freq: Two times a day (BID) | ORAL | 0 refills | Status: AC

## 2020-12-01 MED ORDER — VITAMIN D 125 MCG (5000 UT) PO CAPS: 1.0000 | ORAL_CAPSULE | Freq: Every day | ORAL | 6 refills | Status: DC

## 2020-12-01 MED ORDER — VITAMIN D (ERGOCALCIFEROL) 1.25 MG (50000 UNIT) PO CAPS: 50000.0000 [IU] | ORAL_CAPSULE | ORAL | 0 refills | Status: DC

## 2020-12-01 MED ORDER — ASCORBIC ACID 1000 MG PO TABS: 1000.0000 mg | ORAL_TABLET | Freq: Every day | ORAL | 1 refills | Status: AC

## 2020-12-01 MED ORDER — MELOXICAM 15 MG PO TABS: 15.0000 mg | ORAL_TABLET | Freq: Every day | ORAL | Status: AC

## 2020-12-01 MED ORDER — ENOXAPARIN SODIUM 40 MG/0.4ML ~~LOC~~ SOLN: 40.0000 mg | SUBCUTANEOUS | 0 refills | Status: DC

## 2020-12-01 NOTE — Evaluation (Signed)
Physical Therapy Evaluation Patient Details Name: Samantha Gray MRN: [id number] DOB: Jul 06, 1956 Today's Date: 12/01/2020   History of Present Illness  Pt is a 64 y.o. female who initially fell 11/20/20 sustaining R tibial plateau fx, now admitted 11/30/20 for R tibial ORIF. No significant PMH.    Clinical Impression  Pt presents with an overall decrease in functional mobility secondary to above. Prior to initial injury, pt independent, active, lives with supportive partner, enjoys hiking with grandkids; since tibial fx, has been ambulating with crutches with intermittent assist. Educ on RLE NWB precautions, positioning, edema control, bledsoe brace wear, therex/ROM, and importance of mobility. Today, pt able to initiate transfer and gait training with RW and intermittent min guard; pt limited by pain, but still moving exceptionally well post-op. Pt would benefit from continued acute PT services to maximize functional mobility and independence prior to d/c home. Will plan for stair training before d/c tomorrow.     Follow Up Recommendations No PT follow up;Supervision for mobility/OOB (OP PT once allowed to [facility])    Equipment Recommendations  Rolling walker with 5" wheels    Recommendations for Other Services       Precautions / Restrictions Precautions Precautions: Fall Required Braces or Orthoses: Other Brace Other Brace: Hinged brace on when mobilizing (unlocked) Restrictions Weight Bearing Restrictions: Yes RLE Weight Bearing: Non weight bearing      Mobility  Bed Mobility Overal bed mobility: Needs Assistance Bed Mobility: Supine to Sit     Supine to sit: Min assist;HOB elevated     General bed mobility comments: MinA for RLE management, pt limited by pain    Transfers Overall transfer level: Needs assistance Equipment used: Rolling walker (2 wheeled) Transfers: Sit to/from Stand Sit to Stand: Min guard         General transfer comment: Multiple sit<>stands from  EOB and recliner to RW, min guard for balance and cues for technique; good ability to maintain RLE NWB precautions  Ambulation/Gait Ambulation/Gait assistance: Min guard Gait Distance (Feet): 20 Feet Assistive device: Rolling walker (2 wheeled)   Gait velocity: Decreased   General Gait Details: Slow, antalgic gait with RW and intermittent min guard for balance; good ability to hop on LLE in order to maintain RLE NWB precautions; limited by pain  Stairs Stairs:  (Discussed technique with crutches or rail support, will practice next session)          Wheelchair Mobility    Modified Rankin (Stroke Patients Only)       Balance Overall balance assessment: Needs assistance   Sitting balance-Leahy Scale: Good       Standing balance-Leahy Scale: Poor Standing balance comment: Reliant on UE support                             Pertinent Vitals/Pain Pain Assessment: Faces Faces Pain Scale: Hurts whole lot Pain Location: R knee, especially with ROM Pain Descriptors / Indicators: Discomfort;Grimacing;Guarding Pain Intervention(s): Monitored during session;Limited activity within patient's tolerance;Premedicated before session;Ice applied    Home Living Family/patient expects to be discharged to:: Private residence Living Arrangements: Spouse/significant other Available Help at Discharge: Family;Available 24 hours/day Type of Home: House Home Access: Stairs to enter Entrance Stairs-Rails: Doctor, general practice of Steps: 4 Home Layout: One level Home Equipment: Bedside commode;Crutches;Shower seat;Hand held shower head      Prior Function Level of Independence: Independent         Comments: Prior to initial injury on  11/29, pt independent, active, enjoys hiking with grandkids, used to work as Systems analyst; since injury, has been ambulating with crutches, sitting on stool for showers with HH shower head, husband assisting with household tasks      Hand Dominance        Extremity/Trunk Assessment   Upper Extremity Assessment Upper Extremity Assessment: Overall WFL for tasks assessed    Lower Extremity Assessment Lower Extremity Assessment: RLE deficits/detail RLE Deficits / Details: s/p R tibial ORIF with expected post-op pain/weakness; achieving fully extension, limited knee flexion due to pain and ace wrap; good hip flex/ext activation, wiggling toes RLE: Unable to fully assess due to pain;Unable to fully assess due to immobilization RLE Coordination: decreased gross motor       Communication   Communication: No difficulties  Cognition Arousal/Alertness: Awake/alert Behavior During Therapy: WFL for tasks assessed/performed Overall Cognitive Status: Within Functional Limits for tasks assessed                                        General Comments      Exercises Other Exercises Other Exercises: Gravity-assisted knee flexion sitting at EOB and recliner, slight overpressure from PT to stretch Other Exercises: Pt familiar with exercises from recent THA, discussed quad sets, heel slides (with BUE support to prevent heel WB), LAQ; pt declined HEP handout   Assessment/Plan    PT Assessment Patient needs continued PT services  PT Problem List Decreased strength;Decreased range of motion;Decreased activity tolerance;Decreased balance;Decreased mobility;Decreased knowledge of use of DME;Decreased knowledge of precautions;Pain       PT Treatment Interventions DME instruction;Gait training;Stair training;Functional mobility training;Therapeutic activities;Therapeutic exercise;Balance training;Patient/family education    PT Goals (Current goals can be found in the Care Plan section)  Acute Rehab PT Goals Patient Stated Goal: Home tomorrow; get back to hiking with grandkids PT Goal Formulation: With patient Time For Goal Achievement: 12/15/20 Potential to Achieve Goals: Good    Frequency Min  5X/week   Barriers to discharge        Co-evaluation PT/OT/SLP Co-Evaluation/Treatment:  (dovetail)             AM-PAC PT "6 Clicks" Mobility  Outcome Measure Help needed turning from your back to your side while in a flat bed without using bedrails?: A Little Help needed moving from lying on your back to sitting on the side of a flat bed without using bedrails?: A Little Help needed moving to and from a bed to a chair (including a wheelchair)?: A Little Help needed standing up from a chair using your arms (e.g., wheelchair or bedside chair)?: A Little Help needed to walk in hospital room?: A Little Help needed climbing 3-5 steps with a railing? : A Little 6 Click Score: 18    End of Session Equipment Utilized During Treatment: Gait belt Activity Tolerance: Patient tolerated treatment well Patient left: in chair;with chair alarm set;with call bell/phone within reach Nurse Communication: Mobility status PT Visit Diagnosis: Other abnormalities of gait and mobility (R26.89);Pain Pain - Right/Left: Right Pain - part of body: Knee    Time: 3299-2426 PT Time Calculation (min) (ACUTE ONLY): 24 min   Charges:   PT Evaluation $PT Eval Low Complexity: 1 Low     Ina Homes, PT, DPT Acute Rehabilitation Services  Pager [phone] Office [phone]  Malachy Chamber 12/01/2020, 4:33 PM

## 2020-12-01 NOTE — Discharge Instructions (Signed)
Orthopaedic Trauma Service Discharge Instructions   General Discharge Instructions  Orthopaedic Injuries:        Right tibial plateau fracture treated with open reduction and internal fixation using plate and screws   WEIGHT BEARING STATUS: Nonweightbearing Right leg, use walker or crutches  RANGE OF MOTION/ACTIVITY: unrestricted range of motion Right knee.  Do not leg knee rest in a bent position. Do not place pillows under back of the knee at rest, place under ankle or use bone foam to help keep knee in full extension.  Activity as tolerated while maintaining weightbearing restrictions   Continue to do exercises that therapy taught you   R knee ROM- AROM, PROM. Prone exercises as well. No ROM restrictions.  Quad sets, SLR, LAQ, SAQ, heel slides, stretching, prone flexion and extension              Ankle theraband program, heel cord stretching, toe towel curls, etc              No pillows under bend of knee when at rest, ok to place under heel to help work on extension. Can also use zero knee bone foam if available              Hinged knee brace on only when mobilizing, unlocked.     Bone health: labs show vitamin d deficiency. Take the 2 vitamin d supplements that were prescribed for you  Wound Care: daily wound care starting on 12/03/2020. See below  DISCHARGE WOUND CARE INSTRUCTIONS  Discharge Wound Care Instructions  Do NOT apply any ointments, solutions or lotions to pin sites or surgical wounds.  These prevent needed drainage and even though solutions like hydrogen peroxide kill bacteria, they also damage cells lining the pin sites that help fight infection.  Applying lotions or ointments can keep the wounds moist and can cause them to breakdown and open up as well. This can increase the risk for infection. When in doubt call the office.  Surgical incisions should be dressed daily.  If any drainage is noted, use one layer of adaptic, then gauze, Kerlix, and an ace  wrap.  Once the incision is completely dry and without drainage, it may be left open to air out.  Showering may begin 36-48 hours later.  Cleaning gently with soap and water.  Traumatic wounds should be dressed daily as well.    One layer of adaptic, gauze, Kerlix, then ace wrap.  The adaptic can be discontinued once the draining has ceased    If you have a wet to dry dressing: wet the gauze with saline the squeeze as much saline out so the gauze is moist (not soaking wet), place moistened gauze over wound, then place a dry gauze over the moist one, followed by Kerlix wrap, then ace wrap.   DVT/PE prophylaxis: Lovenox 40 mg subcutaneous injection daily x 21 days   Diet: as you were eating previously.  Can use over the counter stool softeners and bowel preparations, such as Miralax, to help with bowel movements.  Narcotics can be constipating.  Be sure to drink plenty of fluids  PAIN MEDICATION USE AND EXPECTATIONS  You have likely been given narcotic medications to help control your pain.  After a traumatic event that results in an fracture (broken bone) with or without surgery, it is ok to use narcotic pain medications to help control one's pain.  We understand that everyone responds to pain differently and each individual patient will be evaluated on  a regular basis for the continued need for narcotic medications. Ideally, narcotic medication use should last no more than 6-8 weeks (coinciding with fracture healing).   As a patient it is your responsibility as well to monitor narcotic medication use and report the amount and frequency you use these medications when you come to your office visit.   We would also advise that if you are using narcotic medications, you should take a dose prior to therapy to maximize you participation.  IF YOU ARE ON NARCOTIC MEDICATIONS IT IS NOT PERMISSIBLE TO OPERATE A MOTOR VEHICLE (MOTORCYCLE/CAR/TRUCK/MOPED) OR HEAVY MACHINERY DO NOT MIX NARCOTICS WITH OTHER  CNS (CENTRAL NERVOUS SYSTEM) DEPRESSANTS SUCH AS ALCOHOL   STOP SMOKING OR USING NICOTINE PRODUCTS!!!!  As discussed nicotine severely impairs your body's ability to heal surgical and traumatic wounds but also impairs bone healing.  Wounds and bone heal by forming microscopic blood vessels (angiogenesis) and nicotine is a vasoconstrictor (essentially, shrinks blood vessels).  Therefore, if vasoconstriction occurs to these microscopic blood vessels they essentially disappear and are unable to deliver necessary nutrients to the healing tissue.  This is one modifiable factor that you can do to dramatically increase your chances of healing your injury.    (This means no smoking, no nicotine gum, patches, etc)  DO NOT USE NONSTEROIDAL ANTI-INFLAMMATORY DRUGS (NSAID'S)  Using products such as Advil (ibuprofen), Aleve (naproxen), Motrin (ibuprofen) for additional pain control during fracture healing can delay and/or prevent the healing response.  If you would like to take over the counter (OTC) medication, Tylenol (acetaminophen) is ok.  However, some narcotic medications that are given for pain control contain acetaminophen as well. Therefore, you should not exceed more than 4000 mg of tylenol in a day if you do not have liver disease.  Also note that there are may OTC medicines, such as cold medicines and allergy medicines that my contain tylenol as well.  If you have any questions about medications and/or interactions please ask your doctor/PA or your pharmacist.      ICE AND ELEVATE INJURED/OPERATIVE EXTREMITY  Using ice and elevating the injured extremity above your heart can help with swelling and pain control.  Icing in a pulsatile fashion, such as 20 minutes on and 20 minutes off, can be followed.    Do not place ice directly on skin. Make sure there is a barrier between to skin and the ice pack.    Using frozen items such as frozen peas works well as the conform nicely to the are that needs to be  iced.  USE AN ACE WRAP OR TED HOSE FOR SWELLING CONTROL  In addition to icing and elevation, Ace wraps or TED hose are used to help limit and resolve swelling.  It is recommended to use Ace wraps or TED hose until you are informed to stop.    When using Ace Wraps start the wrapping distally (farthest away from the body) and wrap proximally (closer to the body)   Example: If you had surgery on your leg or thing and you do not have a splint on, start the ace wrap at the toes and work your way up to the thigh        If you had surgery on your upper extremity and do not have a splint on, start the ace wrap at your fingers and work your way up to the upper arm  IF YOU ARE IN A SPLINT OR CAST DO NOT REMOVE IT FOR ANY REASON  If your splint gets wet for any reason please contact the office immediately. You may shower in your splint or cast as long as you keep it dry.  This can be done by wrapping in a cast cover or garbage back (or similar)  Do Not stick any thing down your splint or cast such as pencils, money, or hangers to try and scratch yourself with.  If you feel itchy take benadryl as prescribed on the bottle for itching  IF YOU ARE IN A CAM BOOT (BLACK BOOT)  You may remove boot periodically. Perform daily dressing changes as noted below.  Wash the liner of the boot regularly and wear a sock when wearing the boot. It is recommended that you sleep in the boot until told otherwise    Call office for the following:  Temperature greater than 101F  Persistent nausea and vomiting  Severe uncontrolled pain  Redness, tenderness, or signs of infection (pain, swelling, redness, odor or green/yellow discharge around the site)  Difficulty breathing, headache or visual disturbances  Hives  Persistent dizziness or light-headedness  Extreme fatigue  Any other questions or concerns you may have after discharge  In an emergency, call 911 or go to an Emergency Department at a nearby  hospital  HELPFUL INFORMATION  ? If you had a block, it will wear off between 8-24 hrs postop typically.  This is period when your pain may go from nearly zero to the pain you would have had postop without the block.  This is an abrupt transition but nothing dangerous is happening.  You may take an extra dose of narcotic when this happens.  ? You should wean off your narcotic medicines as soon as you are able.  Most patients will be off or using minimal narcotics before their first postop appointment.   ? We suggest you use the pain medication the first night prior to going to bed, in order to ease any pain when the anesthesia wears off. You should avoid taking pain medications on an empty stomach as it will make you nauseous.  ? Do not drink alcoholic beverages or take illicit drugs when taking pain medications.  ? In most states it is against the law to drive while you are in a splint or sling.  And certainly against the law to drive while taking narcotics.  ? You may return to work/school in the next couple of days when you feel up to it.   ? Pain medication may make you constipated.  Below are a few solutions to try in this order: - Decrease the amount of pain medication if you aren't having pain. - Drink lots of decaffeinated fluids. - Drink prune juice and/or each dried prunes  o If the first 3 don't work start with additional solutions - Take Colace - an over-the-counter stool softener - Take Senokot - an over-the-counter laxative - Take Miralax - a stronger over-the-counter laxative     CALL THE OFFICE WITH ANY QUESTIONS OR CONCERNS: [phone]   VISIT OUR WEBSITE FOR ADDITIONAL INFORMATION: [email or web address]

## 2020-12-01 NOTE — Progress Notes (Signed)
Orthopedic Tech Progress Note Patient Details:  Samantha Gray 08-13-1956 [id number]  [name], female   DOB: 25-Mar-1956, 64 y.o.   MRN: [id number] Called order into hanger  Trinna Post 12/01/2020, 6:17 AM

## 2020-12-01 NOTE — Evaluation (Signed)
Occupational Therapy Evaluation Patient Details Name: Samantha Gray MRN: [id number] DOB: [birth year] Today's Date: 12/01/2020    History of Present Illness Pt is a 64 y.o. female who initially fell 11/20/20 sustaining R tibial plateau fx, now admitted 11/30/20 for R tibial ORIF. No significant PMH.   Clinical Impression   PTA, pt was living with her significant other Arnette Norris) and was independent; since initial injury on 11/29, pt has been performing BADLs with supervision and using crutches. Currently, pt requires Supervision-Min Guard A for ADLs and functional mobility using RW. Provided education on LB ADLs with and without AE, toileting, and shower transfer with shower seat; pt demonstrated understanding. Answered all pt questions. Recommend dc home once medically stable per physician. All acute OT needs met and will sign off. Thank you.    Follow Up Recommendations  No OT follow up    Equipment Recommendations  None recommended by OT    Recommendations for Other Services PT consult     Precautions / Restrictions Precautions Precautions: Fall Required Braces or Orthoses: Other Brace Other Brace: Hinged brace on when mobilizing (unlocked) Restrictions Weight Bearing Restrictions: Yes RLE Weight Bearing: Non weight bearing      Mobility Bed Mobility Overal bed mobility: Needs Assistance Bed Mobility: Supine to Sit     Supine to sit: Min assist;HOB elevated     General bed mobility comments: OOB with PT upon arrival    Transfers Overall transfer level: Needs assistance Equipment used: Rolling walker (2 wheeled) Transfers: Sit to/from Stand Sit to Stand: Min guard         General transfer comment: Multiple sit<>stands from EOB and recliner to RW, min guard for balance and cues for technique; good ability to maintain RLE NWB precautions    Balance Overall balance assessment: Needs assistance   Sitting balance-Leahy Scale: Good     Standing balance support:  During functional activity;Bilateral upper extremity supported Standing balance-Leahy Scale: Poor Standing balance comment: Reliant on UE support                           ADL either performed or assessed with clinical judgement   ADL Overall ADL's : Needs assistance/impaired                                       General ADL Comments: Pt performing ADLs and functional mobility at [employer] A level. Providing education on compensatory techniques for LB ADLs, bathing, toileting, and shower transfer. Pt demonstrating understanding with simulated set up. Also providing education on use of reacher for LB dressing as pt with increased pain upon forward bending     Vision         Perception     Praxis      Pertinent Vitals/Pain Pain Assessment: Faces Faces Pain Scale: Hurts whole lot Pain Location: R knee, especially with ROM Pain Descriptors / Indicators: Discomfort;Grimacing;Guarding Pain Intervention(s): Monitored during session;Repositioned     Hand Dominance     Extremity/Trunk Assessment Upper Extremity Assessment Upper Extremity Assessment: Overall WFL for tasks assessed   Lower Extremity Assessment Lower Extremity Assessment: RLE deficits/detail RLE Deficits / Details: s/p R tibial ORIF with expected post-op pain/weakness; achieving fully extension, limited knee flexion due to pain and ace wrap; good hip flex/ext activation, wiggling toes RLE: Unable to fully assess due to pain;Unable to fully assess due to immobilization  RLE Coordination: decreased gross motor   Cervical / Trunk Assessment Cervical / Trunk Assessment: Normal   Communication Communication Communication: No difficulties   Cognition Arousal/Alertness: Awake/alert Behavior During Therapy: WFL for tasks assessed/performed Overall Cognitive Status: Within Functional Limits for tasks assessed                                 General Comments: Very  motivated   General Comments       Exercises Other Exercises Other Exercises: Gravity-assisted knee flexion sitting at EOB and recliner, slight overpressure from PT to stretch Other Exercises: Pt familiar with exercises from recent THA, discussed quad sets, heel slides (with BUE support to prevent heel WB), LAQ; pt declined HEP handout   Shoulder Instructions      Home Living Family/patient expects to be discharged to:: Private residence Living Arrangements: Spouse/significant other Available Help at Discharge: Family;Available 24 hours/day Type of Home: House Home Access: Stairs to enter [employer] of Steps: 4 Entrance Stairs-Rails: Right;Left Home Layout: One level     Bathroom Shower/Tub: Tub/shower unit;Walk-in shower   Bathroom Toilet: Standard (BSC over toilet)     Home Equipment: Bedside commode;Crutches;Shower seat;Hand held shower head          Prior Functioning/Environment Level of Independence: Independent        Comments: Prior to initial injury on 11/29, pt independent, active, enjoys hiking with grandkids, used to work as Physiological scientist; since injury, has been ambulating with crutches, sitting on stool for showers with HH shower head, husband assisting with household tasks        OT Problem List: Decreased activity tolerance;Impaired balance (sitting and/or standing);Decreased knowledge of use of DME or AE;Decreased knowledge of precautions      OT Treatment/Interventions:      OT Goals(Current goals can be found in the care plan section) Acute Rehab OT Goals Patient Stated Goal: Home tomorrow; get back to hiking with grandkids OT Goal Formulation: All assessment and education complete, DC therapy  OT Frequency:     Barriers to D/C:            Co-evaluation PT/OT/SLP Co-Evaluation/Treatment:  (Dove tail)            AM-PAC OT "6 Clicks" Daily Activity     Outcome Measure Help from another person eating meals?: None Help  from another person taking care of personal grooming?: A Little Help from another person toileting, which includes using toliet, bedpan, or urinal?: A Little Help from another person bathing (including washing, rinsing, drying)?: A Little Help from another person to put on and taking off regular upper body clothing?: A Little Help from another person to put on and taking off regular lower body clothing?: A Little 6 Click Score: 19   End of Session Equipment Utilized During Treatment: Rolling walker Nurse Communication: Mobility status  Activity Tolerance: Patient tolerated treatment well Patient left: with call bell/phone within reach;with chair alarm set;in chair  OT Visit Diagnosis: Other abnormalities of gait and mobility (R26.89);Pain Pain - Right/Left: Right Pain - part of body: Leg                Time: 1345-1408 OT Time Calculation (min): 23 min Charges:  OT General Charges $OT Visit: 1 Visit OT Evaluation $OT Eval Low Complexity: 1 Low  Jhamal Plucinski MSOT, OTR/L Acute Rehab Pager: [phone] Office: [place] 12/01/2020, 5:09 PM

## 2020-12-01 NOTE — Anesthesia Postprocedure Evaluation (Signed)
Anesthesia Post Note  Patient: Samantha Gray  Procedure(s) Performed: OPEN REDUCTION INTERNAL FIXATION (ORIF) TIBIAL PLATEAU (Right Leg Lower) OPEN REPAIR OF MENISCUS (Right Knee) ANTERIOR COMPARTMENT FASCIOTOMY (Right Leg Lower)     Patient location during evaluation: PACU Anesthesia Type: General Level of consciousness: awake and alert Pain management: pain level controlled Vital Signs Assessment: post-procedure vital signs reviewed and stable Respiratory status: spontaneous breathing, nonlabored ventilation, respiratory function stable and patient connected to nasal cannula oxygen Cardiovascular status: blood pressure returned to baseline and stable Postop Assessment: no apparent nausea or vomiting Anesthetic complications: no   No complications documented.                Shelton Silvas

## 2020-12-01 NOTE — Progress Notes (Signed)
OT Cancellation Note  Patient Details Name: Samantha Gray MRN: [id number] DOB: Dec 23, 1956   Cancelled Treatment:    Reason Eval/Treat Not Completed: Medical issues which prohibited therapy;Other (comment). Noted orders and plan for hinged knee brace. Will re-attempt OT eval once brace delivered to safely mobilize pt.   Lorre Munroe 12/01/2020, 9:56 AM

## 2020-12-01 NOTE — Plan of Care (Signed)
°  Problem: Education: °Goal: Knowledge of the prescribed therapeutic regimen will improve °Outcome: Progressing °  °Problem: Activity: °Goal: Ability to increase mobility will improve °Outcome: Progressing °  °Problem: Physical Regulation: °Goal: Postoperative complications will be avoided or minimized °Outcome: Progressing °  °Problem: Pain Management: °Goal: Pain level will decrease with appropriate interventions °Outcome: Progressing °  °

## 2020-12-01 NOTE — Progress Notes (Signed)
Orthopaedic Trauma Service Progress Note  [name] MRN: [id number] DOB/AGE: 64-Dec-1957 64 y.o.  Subjective:  Doing ok  Pain quite severe overnight  Minimal relief with meds but doing ok now Doesn't tolerate hydrocodone or oxycodone  Has not worked with therapy yet  No CP or SOB No N/V No Abd pain    ROS As above  Objective:   VITALS:   Vitals:   11/30/20 1900 11/30/20 2045 12/01/20 0041 12/01/20 0827  BP: 137/70 127/68 (!) 101/59 104/67  Pulse: (!) 50 (!) 52 66 (!) 53  Resp: 14 16 16 16   Temp: (!) 97.5 F (36.4 C) (!) 97.3 F (36.3 C) 97.8 F (36.6 C) 98.8 F (37.1 C)  TempSrc: Oral Oral Oral Oral  SpO2: 100% 100% 93% 94%  Weight:      Height:        Estimated body mass index is 28.62 kg/m as calculated from the following:   Height as of this encounter: 5\' 5"  (1.651 m).   Weight as of this encounter: 78 kg.   Intake/Output      12/09 0701 12/10 0700 12/10 0701 12/11 0700   P.O.  240   I.V. (mL/kg) 1919.4 (24.6)    IV Piggyback 700    Total Intake(mL/kg) 2619.4 (33.6) 240 (3.1)   Urine (mL/kg/hr) 600 3800 (15)   Blood 300    Total Output 900 3800   Net +1719.4 -3560          LABS  Results for orders placed or performed during the hospital encounter of 11/30/20 (from the past 24 hour(s))  Urinalysis, Routine w reflex microscopic     Status: None   Collection Time: 11/30/20 10:55 AM  Result Value Ref Range   Color, Urine YELLOW YELLOW   APPearance CLEAR CLEAR   Specific Gravity, Urine 1.010 1.005 - 1.030   pH 7.0 5.0 - 8.0   Glucose, UA NEGATIVE NEGATIVE mg/dL   Hgb urine dipstick NEGATIVE NEGATIVE   Bilirubin Urine NEGATIVE NEGATIVE   Ketones, ur NEGATIVE NEGATIVE mg/dL   Protein, ur NEGATIVE NEGATIVE mg/dL   Nitrite NEGATIVE NEGATIVE   Leukocytes,Ua NEGATIVE NEGATIVE  VITAMIN D 25 Hydroxy (Vit-D Deficiency, Fractures)     Status: Abnormal    Collection Time: 11/30/20 10:56 AM  Result Value Ref Range   Vit D, 25-Hydroxy 18.41 (L) 30 - 100 ng/mL  CBC WITH DIFFERENTIAL     Status: None   Collection Time: 11/30/20 10:56 AM  Result Value Ref Range   WBC 5.3 4.0 - 10.5 K/uL   RBC 4.92 3.87 - 5.11 MIL/uL   Hemoglobin 14.0 12.0 - 15.0 g/dL   HCT 14/09/21 14/09/21 - 06.2 %   MCV 86.4 80.0 - 100.0 fL   MCH 28.5 26.0 - 34.0 pg   MCHC 32.9 30.0 - 36.0 g/dL   RDW 37.6 28.3 - 15.1 %   Platelets 263 150 - 400 K/uL   nRBC 0.0 0.0 - 0.2 %   Neutrophils Relative % 57 %   Neutro Abs 3.0 1.7 - 7.7 K/uL   Lymphocytes Relative 31 %   Lymphs Abs 1.6 0.7 - 4.0 K/uL   Monocytes Relative 9 %   Monocytes Absolute 0.5 0.1 - 1.0 K/uL   Eosinophils Relative 2 %   Eosinophils Absolute 0.1 0.0 -  0.5 K/uL   Basophils Relative 1 %   Basophils Absolute 0.0 0.0 - 0.1 K/uL   Immature Granulocytes 0 %   Abs Immature Granulocytes 0.01 0.00 - 0.07 K/uL  Comprehensive metabolic panel     Status: None   Collection Time: 11/30/20 10:56 AM  Result Value Ref Range   Sodium 141 135 - 145 mmol/L   Potassium 3.8 3.5 - 5.1 mmol/L   Chloride 106 98 - 111 mmol/L   CO2 22 22 - 32 mmol/L   Glucose, Bld 96 70 - 99 mg/dL   BUN 8 8 - 23 mg/dL   Creatinine, Ser 0.01 0.44 - 1.00 mg/dL   Calcium 74.9 8.9 - 44.9 mg/dL   Total Protein 7.3 6.5 - 8.1 g/dL   Albumin 4.1 3.5 - 5.0 g/dL   AST 25 15 - 41 U/L   ALT 20 0 - 44 U/L   Alkaline Phosphatase 104 38 - 126 U/L   Total Bilirubin 1.2 0.3 - 1.2 mg/dL   GFR, Estimated >67 >59 mL/min   Anion gap 13 5 - 15  Protime-INR     Status: None   Collection Time: 11/30/20 10:56 AM  Result Value Ref Range   Prothrombin Time 12.8 11.4 - 15.2 seconds   INR 1.0 0.8 - 1.2  APTT     Status: None   Collection Time: 11/30/20 10:56 AM  Result Value Ref Range   aPTT 32 24 - 36 seconds  Surgical pcr screen     Status: None   Collection Time: 11/30/20 10:57 AM   Specimen: Nasal Mucosa; Nasal Swab  Result Value Ref Range   MRSA, PCR  NEGATIVE NEGATIVE   Staphylococcus aureus NEGATIVE NEGATIVE  Prealbumin     Status: None   Collection Time: 12/01/20  3:53 AM  Result Value Ref Range   Prealbumin 24.1 18 - 38 mg/dL  Basic metabolic panel     Status: Abnormal   Collection Time: 12/01/20  3:53 AM  Result Value Ref Range   Sodium 138 135 - 145 mmol/L   Potassium 4.4 3.5 - 5.1 mmol/L   Chloride 105 98 - 111 mmol/L   CO2 23 22 - 32 mmol/L   Glucose, Bld 131 (H) 70 - 99 mg/dL   BUN 7 (L) 8 - 23 mg/dL   Creatinine, Ser 1.63 0.44 - 1.00 mg/dL   Calcium 9.2 8.9 - 84.6 mg/dL   GFR, Estimated >65 >99 mL/min   Anion gap 10 5 - 15  CBC     Status: Abnormal   Collection Time: 12/01/20  3:53 AM  Result Value Ref Range   WBC 8.4 4.0 - 10.5 K/uL   RBC 3.44 (L) 3.87 - 5.11 MIL/uL   Hemoglobin 9.7 (L) 12.0 - 15.0 g/dL   HCT 35.7 (L) 01.7 - 79.3 %   MCV 87.5 80.0 - 100.0 fL   MCH 28.2 26.0 - 34.0 pg   MCHC 32.2 30.0 - 36.0 g/dL   RDW 90.3 00.9 - 23.3 %   Platelets 215 150 - 400 K/uL   nRBC 0.0 0.0 - 0.2 %     PHYSICAL EXAM:   Gen: resting comfortably in bed, NAD, appears well, pleasant  Lungs: unlabored Cardiac: regular  Ext:       Right Lower Extremity   Dressing c/d/i  Ext warm   Leg in zero knee bone foam, she is in full extension   DPN, SPN, TN sensation intact  EHL, FHL, lesser toe motor intact  Ankle flexion, extension, inversion and eversion intact  No DCT  Compartments are soft  No pain out of proportion with passive stretch   + DP pulse   Swelling stable    Assessment/Plan: 1 Day Post-Op   Principal Problem:   Closed fracture of right proximal tibia Active Problems:   GERD (gastroesophageal reflux disease)   Hyperlipidemia   Anti-infectives (From admission, onward)   Start     Dose/Rate Route Frequency Ordered Stop   11/30/20 2030  ceFAZolin (ANCEF) IVPB 1 g/50 mL premix        1 g 100 mL/hr over 30 Minutes Intravenous Every 6 hours 11/30/20 1932 12/01/20 0924   11/30/20 1100  ceFAZolin  (ANCEF) IVPB 2g/100 mL premix        2 g 200 mL/hr over 30 Minutes Intravenous On call to O.R. 11/30/20 1056 11/30/20 1514    .  POD/HD#: 1  64 y/o female s/p ORIF R tibial plateau fracture   -R Shatzker 2 tibial plateau fracture s/p ORIF   NWB R leg x 6 weeks  Unrestricted ROM R knee    Hinge brace only needs to be on when mobilizing   Dressing changes starting on Sunday    Can leave incision open to air once dry and clean with soap and water only   Ice and elevate for swelling and pain control   PT/OT evals   PT- please teach HEP for R knee ROM- AROM, PROM. Prone exercises as well. No ROM restrictions.  Quad sets, SLR, LAQ, SAQ, heel slides, stretching, prone flexion and extension   Ankle theraband program, heel cord stretching, toe towel curls, etc   No pillows under bend of knee when at rest, ok to place under heel to help work on extension. Can also use zero knee bone foam if available   Hinged knee brace on only when mobilizing, unlocked.    - Pain management:  Continue with ultram, robaxin and IV morphine   Add ketorolac  Pt intolerant of oxycodone and hydrocodone and requested not to receive these  If pain still severe can try po dilaudid   - ABL anemia/Hemodynamics  Stable  - Medical issues   Home meds  - DVT/PE prophylaxis:  lovenox 40 mg sq daily x 21 days   - ID:   periop abx completed today   - Metabolic Bone Disease:  Vitamin d deficiency    Supplement   Recommend dexa in 4-8 weeks  Meets criteria for fracture liaison service. Consult placed   - Activity:  NWB R leg  Up with assistance  - FEN/ GI prophylaxis/Foley/Lines:  Reg dieg  Dc foley   Continue with IVF, KVO once PO intake is good   - Impediments to fracture healing:  Vitamin d deficiency, osteoporosis   - Dispo:  Continue with inpatient care today   Pain control  Likely home tomorrow  Arrange DME/HHPT  Follow up with OTS in 2-3 weeks      Mearl Latin, PA-C [phone]  (C) 12/01/2020, 10:16 AM  [facility] [address] [place] [address] [phone] [phone] (F)    After 5pm and on the weekends please log on to Amion, go to orthopaedics and the look under the [facility] Call for the provider(s) on call. You can also call our office at [phone] and then follow the prompts to be connected to the call team.

## 2020-12-02 LAB — BASIC METABOLIC PANEL
Anion gap: 7 (ref 5–15)
BUN: 7 mg/dL — ABNORMAL LOW (ref 8–23)
CO2: 26 mmol/L (ref 22–32)
Calcium: 9 mg/dL (ref 8.9–10.3)
Chloride: 106 mmol/L (ref 98–111)
Creatinine, Ser: 0.84 mg/dL (ref 0.44–1.00)
GFR, Estimated: >60 mL/min (ref >60)
Glucose, Bld: 111 mg/dL — ABNORMAL HIGH (ref 70–99)
Potassium: 4.1 mmol/L (ref 3.5–5.1)
Sodium: 139 mmol/L (ref 135–145)

## 2020-12-02 LAB — CBC
HCT: 26.5 % — ABNORMAL LOW (ref 36.0–46.0)
Hemoglobin: 8.8 g/dL — ABNORMAL LOW (ref 12.0–15.0)
MCH: 28.8 pg (ref 26.0–34.0)
MCHC: 33.2 g/dL (ref 30.0–36.0)
MCV: 86.6 fL (ref 80.0–100.0)
Platelets: 190 10*3/uL (ref 150–400)
RBC: 3.06 MIL/uL — ABNORMAL LOW (ref 3.87–5.11)
RDW: 13.8 % (ref 11.5–15.5)
WBC: 6.7 10*3/uL (ref 4.0–10.5)
nRBC: 0 % (ref 0.0–0.2)

## 2020-12-02 NOTE — TOC Initial Note (Signed)
Transition of Care [facility]) - Initial/Assessment Note    Patient Details  Name: Samantha Gray MRN: [id number] Date of Birth: 08/27/1956  Transition of [facility]) CM/SW Contact:    Kingsley Plan, RN Phone Number: 12/02/2020, 8:53 AM  Clinical Narrative:                  Spoke to patient at bedside. Confirmed address and phone number  PCP is Hartley Barefoot at [facility].   Patient already has walker at home.   Patient uninsured. Referral for HHPT given to and accepted by [facility] with [facility]. Patient aware.  Expected Discharge Plan: Home w Home Health Services Barriers to Discharge: No Barriers Identified   Patient Goals and CMS Choice Patient states their goals for this hospitalization and ongoing recovery are:: to return to home CMS Medicare.gov Compare Post Acute Care list provided to:: Patient Choice offered to / list presented to : [facility]  Expected Discharge Plan and Services Expected Discharge Plan: Home w Home Health Services   Discharge Planning Services: CM Consult Post Acute Care Choice: Home Health Living arrangements for the past 2 months: Single Family Home Expected Discharge Date: 12/02/20               DME Arranged: N/A         HH Arranged: PT HH Agency: [facility] Date [facility] Agency Contacted: 12/02/20 Time HH Agency Contacted: [phone] Representative spoke with at [facility] Agency: cory  Prior Living Arrangements/Services Living arrangements for the past 2 months: Single Family Home Lives with:: Spouse Patient language and need for interpreter reviewed:: Yes Do you feel safe going back to the place where you live?: Yes      Need for Family Participation in Patient Care: Yes (Comment) Care giver support system in place?: Yes (comment) Current home services: DME Criminal Activity/Legal Involvement Pertinent to Current Situation/Hospitalization: No - Comment as needed  Activities of Daily Living Home Assistive Devices/Equipment:  Eyeglasses ADL Screening (condition at time of admission) Patient's cognitive ability adequate to safely complete daily activities?: Yes Is the patient deaf or have difficulty hearing?: No Does the patient have difficulty seeing, even when wearing glasses/contacts?: No Does the patient have difficulty concentrating, remembering, or making decisions?: No Patient able to express need for assistance with ADLs?: Yes Does the patient have difficulty dressing or bathing?: No Independently performs ADLs?: Yes (appropriate for developmental age) Does the patient have difficulty walking or climbing stairs?: Yes Weakness of Legs: Right Weakness of Arms/Hands: None  Permission Sought/Granted   Permission granted to share information with : Yes, Verbal Permission Granted  Share Information with NAME: Aneta Mins significant other  Permission granted to share info w AGENCY: [facility]        Emotional Assessment Appearance:: Appears stated age Attitude/Demeanor/Rapport: Engaged Affect (typically observed): Accepting Orientation: : Oriented to Self,Oriented to Place,Oriented to  Time,Oriented to Situation Alcohol / Substance Use: Not Applicable Psych Involvement: No (comment)  Admission diagnosis:  Closed fracture of right proximal tibia [S82.101A] Patient Active Problem List   Diagnosis Date Noted  . GERD (gastroesophageal reflux disease)   . Hyperlipidemia   . Closed fracture of right proximal tibia 11/30/2020   PCP:  [facility]:   [id number] [place], [place] - [address] AT [facility] [address] [place] [address] Phone: [phone] Fax: [phone]  [facility],  - [address] [place],  [address] [place], [address] [place] [address] Phone: [phone] Fax: [phone]     Social Determinants of Health (SDOH) Interventions    Readmission Risk Interventions No  flowsheet data found.

## 2020-12-02 NOTE — Progress Notes (Signed)
Physical Therapy Treatment Patient Details Name: Samantha Gray MRN: [id number] DOB: Dec 16, 1956 Today's Date: 12/02/2020    History of Present Illness Pt is a 64 y.o. female who initially fell 11/20/20 sustaining R tibial plateau fx, now admitted 11/30/20 for R tibial ORIF. No significant PMH.    PT Comments    Pt required min guard assist bed mobility, transfers, and ambulation 20' with RW. Good ability to maintain NWB RLE. Stair training complete. Ascend/descend 3 steps with RW min assist. Husband present and assisting in session. HEP reviewed. Plan is for d/c home today.   Follow Up Recommendations  No PT follow up;Supervision for mobility/OOB     Equipment Recommendations  Rolling walker with 5" wheels    Recommendations for Other Services       Precautions / Restrictions Precautions Precautions: Fall Required Braces or Orthoses: Other Brace Other Brace: Hinged brace on when mobilizing (unlocked) Restrictions Weight Bearing Restrictions: Yes RLE Weight Bearing: Non weight bearing    Mobility  Bed Mobility Overal bed mobility: Needs Assistance Bed Mobility: Supine to Sit     Supine to sit: Min guard;HOB elevated     General bed mobility comments: +rail, increased time  Transfers Overall transfer level: Needs assistance Equipment used: Rolling walker (2 wheeled) Transfers: Sit to/from [employer] Sit to Stand: Min guard Stand pivot transfers: Min guard       General transfer comment: min guard for safety  Ambulation/Gait Ambulation/Gait assistance: Min guard Gait Distance (Feet): 20 Feet Assistive device: Rolling walker (2 wheeled) Gait Pattern/deviations: Step-to pattern Gait velocity: Decreased   General Gait Details: Good ability to maintain NWB RLE   Stairs Stairs: Yes Stairs assistance: Min assist Stair Management: Step to pattern;Backwards;With walker Number of Stairs: 3 General stair comments: cues for sequencing.  Initially attempted ascend with L rail and 1 crutch on R. Pt felt unsteady so transitioned to RW.   Wheelchair Mobility    Modified Rankin (Stroke Patients Only)       Balance Overall balance assessment: Needs assistance Sitting-balance support: No upper extremity supported;Feet supported Sitting balance-Leahy Scale: Good     Standing balance support: During functional activity;Bilateral upper extremity supported Standing balance-Leahy Scale: Poor Standing balance comment: Reliant on UE support due to NWB RLE                            Cognition Arousal/Alertness: Awake/alert Behavior During Therapy: WFL for tasks assessed/performed Overall Cognitive Status: Within Functional Limits for tasks assessed                                        Exercises General Exercises - Lower Extremity Ankle Circles/Pumps: AROM;Both;5 reps Heel Slides: Right;5 reps;AAROM    General Comments General comments (skin integrity, edema, etc.): Husband present and engaged in session.      Pertinent Vitals/Pain Pain Assessment: Faces Faces Pain Scale: Hurts even more Pain Location: R knee, especially with ROM Pain Descriptors / Indicators: Discomfort;Grimacing;Guarding Pain Intervention(s): Monitored during session;Repositioned    Home Living                      Prior Function            PT Goals (current goals can now be found in the care plan section) Acute Rehab PT Goals Patient Stated Goal: home Progress towards PT  goals: Progressing toward goals    Frequency    Min 5X/week      PT Plan Current plan remains appropriate    Co-evaluation              AM-PAC PT "6 Clicks" Mobility   Outcome Measure  Help needed turning from your back to your side while in a flat bed without using bedrails?: None Help needed moving from lying on your back to sitting on the side of a flat bed without using bedrails?: A Little Help needed moving  to and from a bed to a chair (including a wheelchair)?: A Little Help needed standing up from a chair using your arms (e.g., wheelchair or bedside chair)?: A Little Help needed to walk in hospital room?: A Little Help needed climbing 3-5 steps with a railing? : A Little 6 Click Score: 19    End of Session Equipment Utilized During Treatment: Gait belt Activity Tolerance: Patient tolerated treatment well Patient left: in chair;with call bell/phone within reach;with family/visitor present Nurse Communication: Mobility status PT Visit Diagnosis: Other abnormalities of gait and mobility (R26.89);Pain Pain - Right/Left: Right Pain - part of body: Knee     Time: 6010-9323 PT Time Calculation (min) (ACUTE ONLY): 25 min  Charges:  $Gait Training: 23-37 mins                     Aida Raider, [place]  Office # [phone] Pager [phone]    Ilda Foil 12/02/2020, 10:17 AM

## 2020-12-02 NOTE — Discharge Summary (Addendum)
[name] MRN: [id number] DOB/AGE: 64-Aug-1957 64 y.o.  Admit date: 11/30/2020 Discharge date: 12/02/2020  Admission Diagnoses:  Principal Problem:   Closed fracture of right proximal tibia Active Problems:   GERD (gastroesophageal reflux disease)   Hyperlipidemia   Discharge Diagnoses:  Same  Past Medical History:  Diagnosis Date   Arthritis    GERD (gastroesophageal reflux disease)    Hyperlipidemia    PONV (postoperative nausea and vomiting)    low blood pressure - Patient stated "had to bring her back after uterine Ablation" 64 years old    Surgeries: Procedure(s): OPEN REDUCTION INTERNAL FIXATION (ORIF) TIBIAL PLATEAU AND EMINENCE/ SPINE OPEN REPAIR OF MENISCUS ANTERIOR COMPARTMENT FASCIOTOMY on 11/30/2020   Consultants:    Discharged Condition: Improved  Hospital Course: Samantha Gray is an 64 y.o. female who was admitted 11/30/2020 for operative treatment ofClosed fracture of right proximal tibia. Patient has severe unremitting pain that affects sleep, daily activities, and work/hobbies. After pre-op clearance the patient was taken to the operating room on 11/30/2020 and underwent  Procedure(s): OPEN REDUCTION INTERNAL FIXATION (ORIF) TIBIAL PLATEAU OPEN REPAIR OF MENISCUS ANTERIOR COMPARTMENT FASCIOTOMY.    Patient was given perioperative antibiotics:  Anti-infectives (From admission, onward)    Start     Dose/Rate Route Frequency Ordered Stop   11/30/20 2030  ceFAZolin (ANCEF) IVPB 1 g/50 mL premix        1 g 100 mL/hr over 30 Minutes Intravenous Every 6 hours 11/30/20 1932 12/01/20 0924   11/30/20 1100  ceFAZolin (ANCEF) IVPB 2g/100 mL premix        2 g 200 mL/hr over 30 Minutes Intravenous On call to O.R. 11/30/20 1056 11/30/20 1514        Patient was given sequential compression devices, early ambulation, and chemoprophylaxis to prevent DVT.  Patient benefited maximally from hospital stay and there were no complications.    Recent  vital signs:  Patient Vitals for the past 24 hrs:  BP Temp Temp src Pulse Resp SpO2  12/02/20 0406 (!) 111/57 98.7 F (37.1 C) Oral 70 14 94 %  12/02/20 0042 (!) 106/56 98 F (36.7 C) Oral 62 16 97 %  12/01/20 2008 (!) 93/50 97.9 F (36.6 C) Oral 61 15 98 %  12/01/20 1816 (!) 107/55 98.4 F (36.9 C) Oral 64 17 99 %  12/01/20 1416 (!) 105/59 98.6 F (37 C) Oral 66 17 95 %  12/01/20 0827 104/67 98.8 F (37.1 C) Oral (!) 53 16 94 %     Recent laboratory studies:  Recent Labs    11/30/20 1056 12/01/20 0353 12/02/20 0313  WBC 5.3 8.4 6.7  HGB 14.0 9.7* 8.8*  HCT 42.5 30.1* 26.5*  PLT 263 215 190  NA 141 138 139  K 3.8 4.4 4.1  CL 106 105 106  CO2 BUN 8 7* 7*  CREATININE 0.80 0.76 0.84  GLUCOSE 96 131* 111*  INR 1.0  --   --   CALCIUM 10.1 9.2 9.0     Discharge Medications:   Allergies as of 12/02/2020       Reactions   Codeine Nausea And Vomiting   Hydrocodone-acetaminophen Other (See Comments)   Bed moving and voices slow   Oxycodone-acetaminophen    Prednisone Swelling        Medication List     TAKE these medications    acetaminophen 500 MG tablet Commonly known as: TYLENOL Take 1,000 mg by mouth every 6 (six) hours as  needed for mild pain or moderate pain.   ascorbic acid 1000 MG tablet Commonly known as: VITAMIN C Take 1 tablet (1,000 mg total) by mouth daily.   docusate sodium 100 MG capsule Commonly known as: COLACE Take 1 capsule (100 mg total) by mouth 2 (two) times daily.   enoxaparin 40 MG/0.4ML injection Commonly known as: LOVENOX Inject 0.4 mLs (40 mg total) into the skin daily for 21 days.   fluticasone 50 MCG/ACT nasal spray Commonly known as: FLONASE Place 2 sprays into both nostrils daily as needed for allergies or rhinitis.   loratadine-pseudoephedrine 5-120 MG tablet Commonly known as: CLARITIN-D 12-hour Take 1 tablet by mouth daily as needed for allergies.   meloxicam 15 MG tablet Commonly known as: MOBIC Take  1 tablet (15 mg total) by mouth daily. Start taking on: January 01, 2021 What changed: These instructions start on January 01, 2021. If you are unsure what to do until then, ask your doctor or other care provider.   methocarbamol 500 MG tablet Commonly known as: Robaxin Take 1 tablet (500 mg total) by mouth every 6 (six) hours as needed. What changed: reasons to take this   pantoprazole 40 MG tablet Commonly known as: PROTONIX Take 40 mg by mouth daily.   rosuvastatin 10 MG tablet Commonly known as: CRESTOR TAKE 1 TABLET BY MOUTH  DAILY   traMADol 50 MG tablet Commonly known as: Ultram Take 1 tablet (50 mg total) by mouth every 6 (six) hours as needed. What changed: reasons to take this   valACYclovir 500 MG tablet Commonly known as: VALTREX Take 500 mg by mouth daily as needed (fever Blister).   Vitamin D (Ergocalciferol) 1.25 MG (50000 UNIT) Caps capsule Commonly known as: DRISDOL Take 1 capsule (50,000 Units total) by mouth every 7 (seven) days. Start taking on: December 07, 2020   Vitamin D 125 MCG (5000 UT) Caps Take 1 capsule by mouth daily.               Discharge Care Instructions  (From admission, onward)           Start     Ordered   12/01/20 0000  Non weight bearing       Question Answer Comment  Laterality right   Extremity Lower      12/01/20 1149            Diagnostic Studies: DG Tibia/Fibula Right  Result Date: 11/30/2020 CLINICAL DATA:  ORIF of the tibia. EXAM: RIGHT TIBIA AND FIBULA - 2 VIEW COMPARISON:  None. FINDINGS: The patient has undergone plate screw fixation of the proximal tibia. The hardware appears intact. There are expected postsurgical changes. IMPRESSION: Expected postsurgical changes of ORIF of the proximal right tibia. No evidence of hardware complication. Electronically Signed   By: Katherine Mantle M.D.   On: 11/30/2020 19:40   DG Knee Right Port  Result Date: 11/30/2020 CLINICAL DATA:  Postop. EXAM: PORTABLE  RIGHT KNEE - 1-2 VIEW COMPARISON:  None. FINDINGS: The patient has undergone plate screw fixation of the proximal tibia. The hardware appears intact. There are expected postsurgical changes. IMPRESSION: Status post ORIF of the proximal tibia. No evidence of hardware complication. Electronically Signed   By: Katherine Mantle M.D.   On: 11/30/2020 19:41   DG C-Arm 1-60 Min  Result Date: 11/30/2020 CLINICAL DATA:  ORIF EXAM: DG C-ARM 1-60 MIN FLUOROSCOPY TIME:  Fluoroscopy Time:  1 minutes 14 seconds Radiation Exposure Index (if provided by the fluoroscopic device): 4.59  mGy Number of Acquired Spot Images: 7 COMPARISON:  None. FINDINGS: The patient has undergone plate screw fixation of the proximal tibia. The hardware appears intact. There are expected postsurgical changes. IMPRESSION: Status post ORIF. Electronically Signed   By: Katherine Mantle M.D.   On: 11/30/2020 19:41    Disposition: Discharge disposition: 01-Home or Self Care       Discharge Instructions     Call MD / Call 911   Complete by: As directed    If you experience chest pain or shortness of breath, CALL 911 and be transported to the hospital emergency room.  If you develope a fever above 101 F, pus (white drainage) or increased drainage or redness at the wound, or calf pain, call your surgeon's office.   Constipation Prevention   Complete by: As directed    Drink plenty of fluids.  Prune juice may be helpful.  You may use a stool softener, such as Colace (over the counter) 100 mg twice a day.  Use MiraLax (over the counter) for constipation as needed.   Diet general   Complete by: As directed    Discharge instructions   Complete by: As directed    Orthopaedic Trauma Service Discharge Instructions   General Discharge Instructions  Orthopaedic Injuries:        Right tibial plateau fracture treated with open reduction and internal fixation using plate and screws   WEIGHT BEARING STATUS: Nonweightbearing Right leg,  use walker or crutches  RANGE OF MOTION/ACTIVITY: unrestricted range of motion Right knee.  Do not leg knee rest in a bent position. Do not place pillows under back of the knee at rest, place under ankle or use bone foam to help keep knee in full extension.  Activity as tolerated while maintaining weightbearing restrictions   Continue to do exercises that therapy taught you   R knee ROM- AROM, PROM. Prone exercises as well. No ROM restrictions.  Quad sets, SLR, LAQ, SAQ, heel slides, stretching, prone flexion and extension               Ankle theraband program, heel cord stretching, toe towel curls, etc               No pillows under bend of knee when at rest, ok to place under heel to help work on extension. Can also use zero knee bone foam if available               Hinged knee brace on only when mobilizing, unlocked.     Bone health: labs show vitamin d deficiency. Take the 2 vitamin d supplements that were prescribed for you  Wound Care: daily wound care starting on 12/03/2020. See below  DISCHARGE WOUND CARE INSTRUCTIONS  Discharge Wound Care Instructions  Do NOT apply any ointments, solutions or lotions to pin sites or surgical wounds.  These prevent needed drainage and even though solutions like hydrogen peroxide kill bacteria, they also damage cells lining the pin sites that help fight infection.  Applying lotions or ointments can keep the wounds moist and can cause them to breakdown and open up as well. This can increase the risk for infection. When in doubt call the office.  Surgical incisions should be dressed daily.  If any drainage is noted, use one layer of adaptic, then gauze, Kerlix, and an ace wrap.  Once the incision is completely dry and without drainage, it may be left open to air out.  Showering may begin 36-48 hours later.  Cleaning gently with soap and water.  Traumatic wounds should be dressed daily as well.    One layer of adaptic, gauze, Kerlix, then ace  wrap.  The adaptic can be discontinued once the draining has ceased    If you have a wet to dry dressing: wet the gauze with saline the squeeze as much saline out so the gauze is moist (not soaking wet), place moistened gauze over wound, then place a dry gauze over the moist one, followed by Kerlix wrap, then ace wrap.   DVT/PE prophylaxis: Lovenox 40 mg subcutaneous injection daily x 21 days   Diet: as you were eating previously.  Can use over the counter stool softeners and bowel preparations, such as Miralax, to help with bowel movements.  Narcotics can be constipating.  Be sure to drink plenty of fluids  PAIN MEDICATION USE AND EXPECTATIONS  You have likely been given narcotic medications to help control your pain.  After a traumatic event that results in an fracture (broken bone) with or without surgery, it is ok to use narcotic pain medications to help control one's pain.  We understand that everyone responds to pain differently and each individual patient will be evaluated on a regular basis for the continued need for narcotic medications. Ideally, narcotic medication use should last no more than 6-8 weeks (coinciding with fracture healing).   As a patient it is your responsibility as well to monitor narcotic medication use and report the amount and frequency you use these medications when you come to your office visit.   We would also advise that if you are using narcotic medications, you should take a dose prior to therapy to maximize you participation.  IF YOU ARE ON NARCOTIC MEDICATIONS IT IS NOT PERMISSIBLE TO OPERATE A MOTOR VEHICLE (MOTORCYCLE/CAR/TRUCK/MOPED) OR HEAVY MACHINERY DO NOT MIX NARCOTICS WITH OTHER CNS (CENTRAL NERVOUS SYSTEM) DEPRESSANTS SUCH AS ALCOHOL   STOP SMOKING OR USING NICOTINE PRODUCTS!!!!  As discussed nicotine severely impairs your body's ability to heal surgical and traumatic wounds but also impairs bone healing.  Wounds and bone heal by forming microscopic  blood vessels (angiogenesis) and nicotine is a vasoconstrictor (essentially, shrinks blood vessels).  Therefore, if vasoconstriction occurs to these microscopic blood vessels they essentially disappear and are unable to deliver necessary nutrients to the healing tissue.  This is one modifiable factor that you can do to dramatically increase your chances of healing your injury.    (This means no smoking, no nicotine gum, patches, etc)  DO NOT USE NONSTEROIDAL ANTI-INFLAMMATORY DRUGS (NSAID'S)  Using products such as Advil (ibuprofen), Aleve (naproxen), Motrin (ibuprofen) for additional pain control during fracture healing can delay and/or prevent the healing response.  If you would like to take over the counter (OTC) medication, Tylenol (acetaminophen) is ok.  However, some narcotic medications that are given for pain control contain acetaminophen as well. Therefore, you should not exceed more than 4000 mg of tylenol in a day if you do not have liver disease.  Also note that there are may OTC medicines, such as cold medicines and allergy medicines that my contain tylenol as well.  If you have any questions about medications and/or interactions please ask your doctor/PA or your pharmacist.      ICE AND ELEVATE INJURED/OPERATIVE EXTREMITY  Using ice and elevating the injured extremity above your heart can help with swelling and pain control.  Icing in a pulsatile fashion, such as 20 minutes on and 20 minutes off, can be followed.  Do not place ice directly on skin. Make sure there is a barrier between to skin and the ice pack.    Using frozen items such as frozen peas works well as the conform nicely to the are that needs to be iced.  USE AN ACE WRAP OR TED HOSE FOR SWELLING CONTROL  In addition to icing and elevation, Ace wraps or TED hose are used to help limit and resolve swelling.  It is recommended to use Ace wraps or TED hose until you are informed to stop.    When using Ace Wraps start the  wrapping distally (farthest away from the body) and wrap proximally (closer to the body)   Example: If you had surgery on your leg or thing and you do not have a splint on, start the ace wrap at the toes and work your way up to the thigh        If you had surgery on your upper extremity and do not have a splint on, start the ace wrap at your fingers and work your way up to the upper arm  IF YOU ARE IN A SPLINT OR CAST DO NOT REMOVE IT FOR ANY REASON   If your splint gets wet for any reason please contact the office immediately. You may shower in your splint or cast as long as you keep it dry.  This can be done by wrapping in a cast cover or garbage back (or similar)  Do Not stick any thing down your splint or cast such as pencils, money, or hangers to try and scratch yourself with.  If you feel itchy take benadryl as prescribed on the bottle for itching  IF YOU ARE IN A CAM BOOT (BLACK BOOT)  You may remove boot periodically. Perform daily dressing changes as noted below.  Wash the liner of the boot regularly and wear a sock when wearing the boot. It is recommended that you sleep in the boot until told otherwise    Call office for the following: Temperature greater than 101F Persistent nausea and vomiting Severe uncontrolled pain Redness, tenderness, or signs of infection (pain, swelling, redness, odor or green/yellow discharge around the site) Difficulty breathing, headache or visual disturbances Hives Persistent dizziness or light-headedness Extreme fatigue Any other questions or concerns you may have after discharge  In an emergency, call 911 or go to an Emergency Department at a nearby hospital  HELPFUL INFORMATION  If you had a block, it will wear off between 8-24 hrs postop typically.  This is period when your pain may go from nearly zero to the pain you would have had postop without the block.  This is an abrupt transition but nothing dangerous is happening.  You may take an extra  dose of narcotic when this happens.  You should wean off your narcotic medicines as soon as you are able.  Most patients will be off or using minimal narcotics before their first postop appointment.   We suggest you use the pain medication the first night prior to going to bed, in order to ease any pain when the anesthesia wears off. You should avoid taking pain medications on an empty stomach as it will make you nauseous.  Do not drink alcoholic beverages or take illicit drugs when taking pain medications.  In most states it is against the law to drive while you are in a splint or sling.  And certainly against the law to drive while taking narcotics.  You may return to  work/school in the next couple of days when you feel up to it.   Pain medication may make you constipated.  Below are a few solutions to try in this order: Decrease the amount of pain medication if you aren't having pain. Drink lots of decaffeinated fluids. Drink prune juice and/or each dried prunes  If the first 3 don't work start with additional solutions Take Colace - an over-the-counter stool softener Take Senokot - an over-the-counter laxative Take Miralax - a stronger over-the-counter laxative     CALL THE OFFICE WITH ANY QUESTIONS OR CONCERNS: [phone]   VISIT OUR WEBSITE FOR ADDITIONAL INFORMATION: [email or web address]   Do not put a pillow under the knee. Place it under the heel.   Complete by: As directed    Driving restrictions   Complete by: As directed    No driving   Increase activity slowly as tolerated   Complete by: As directed    Non weight bearing   Complete by: As directed    Laterality: right   Extremity: Lower        Follow-up Information     Myrene Galas, MD. Schedule an appointment as soon as possible for a visit in 3 week(s).   Specialty: Orthopedic Surgery Contact information: [address] [place] [address] [phone]                Patient doing  great this am ready for discharge.  Signed: Pascal Lux 12/02/2020, 7:55 AM

## 2020-12-02 NOTE — Progress Notes (Signed)
Discharge instructions addressed; Pt in stable condition;Husband in room and will be pt's ride home. 

## 2022-05-02 IMAGING — RF DG TIBIA/FIBULA 2V*R*
1 series · 7 of 7 positions shown · non-contrast
Comparison: None.

CLINICAL DATA: ORIF of the tibia.

EXAM:
RIGHT TIBIA AND FIBULA - 2 VIEW

[Series 1: run · 7 of 7 slices shown]
[im 1/7]
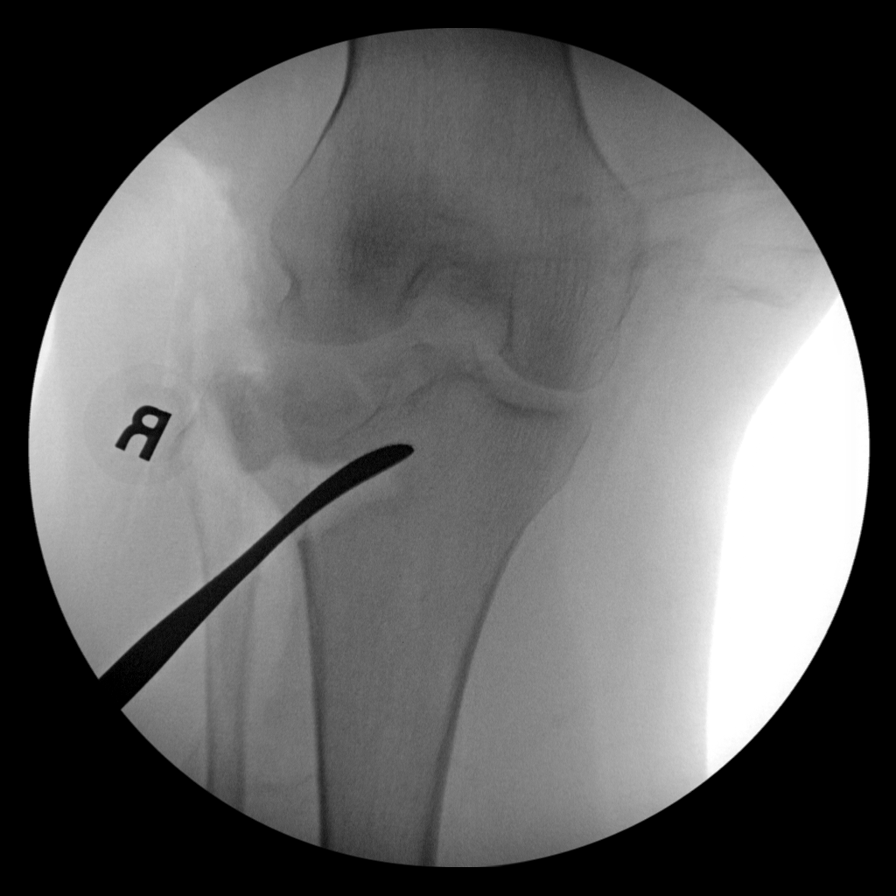
[im 2/7]
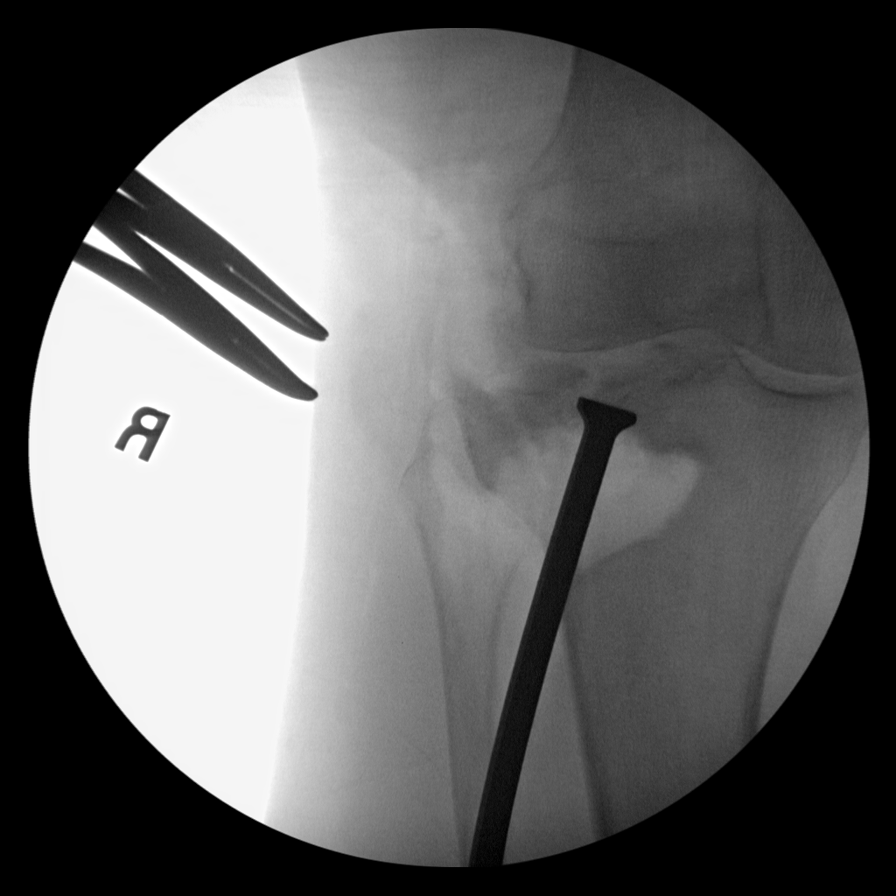
[im 3/7]
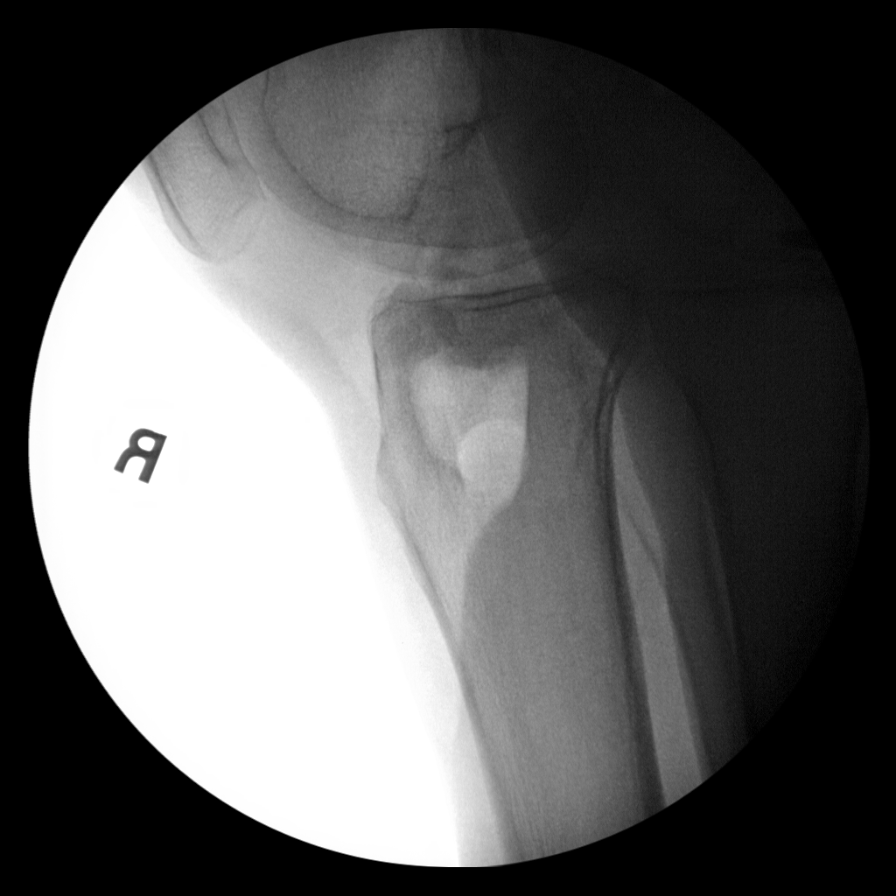
[im 4/7]
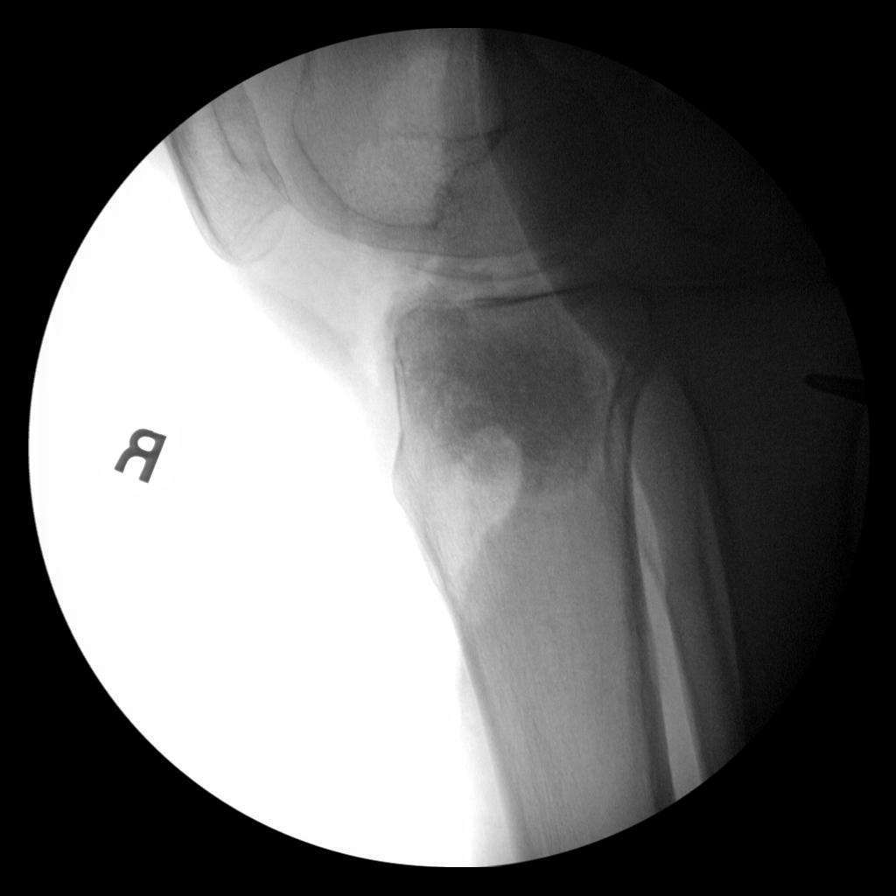
[im 5/7]
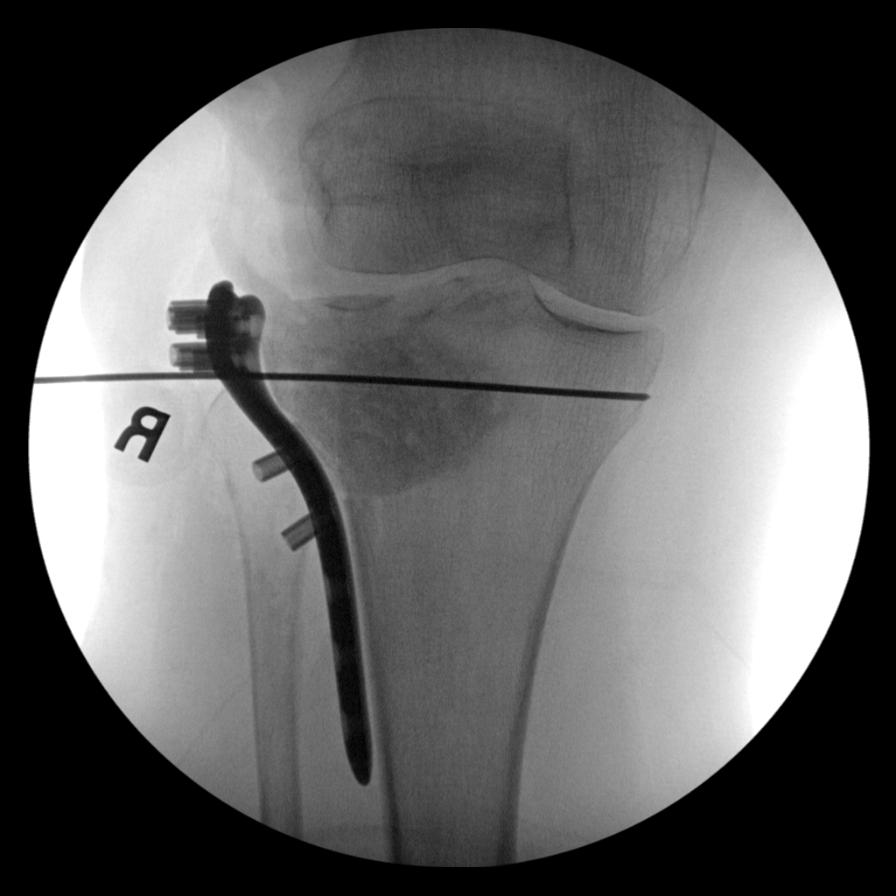
[im 6/7]
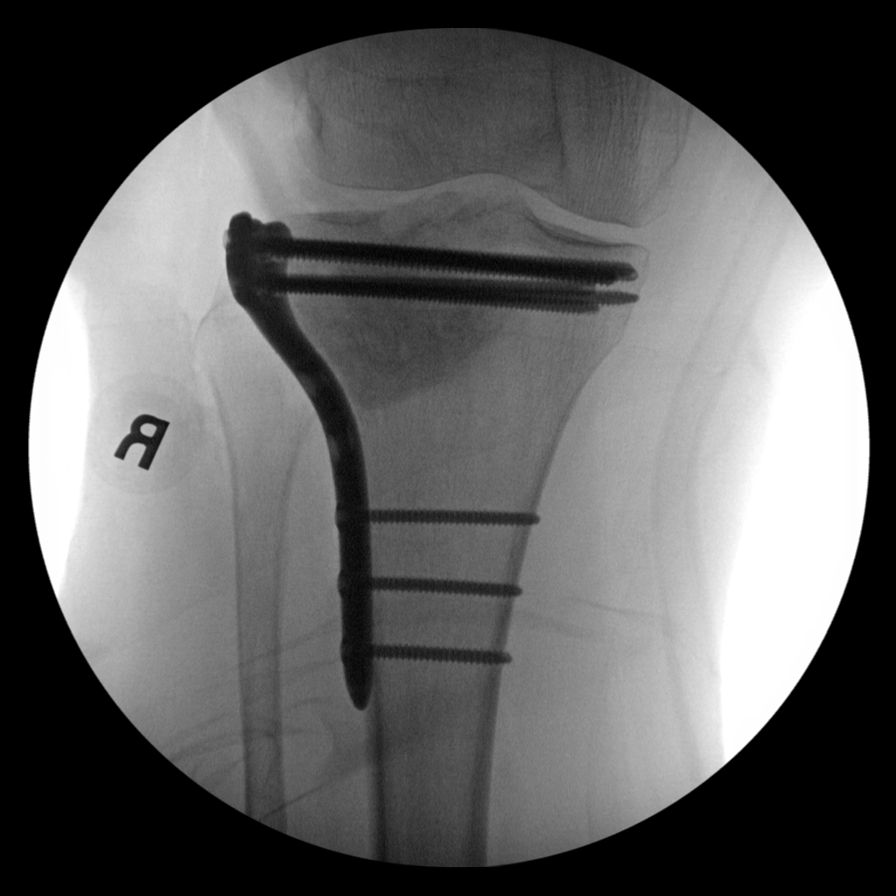
[im 7/7]
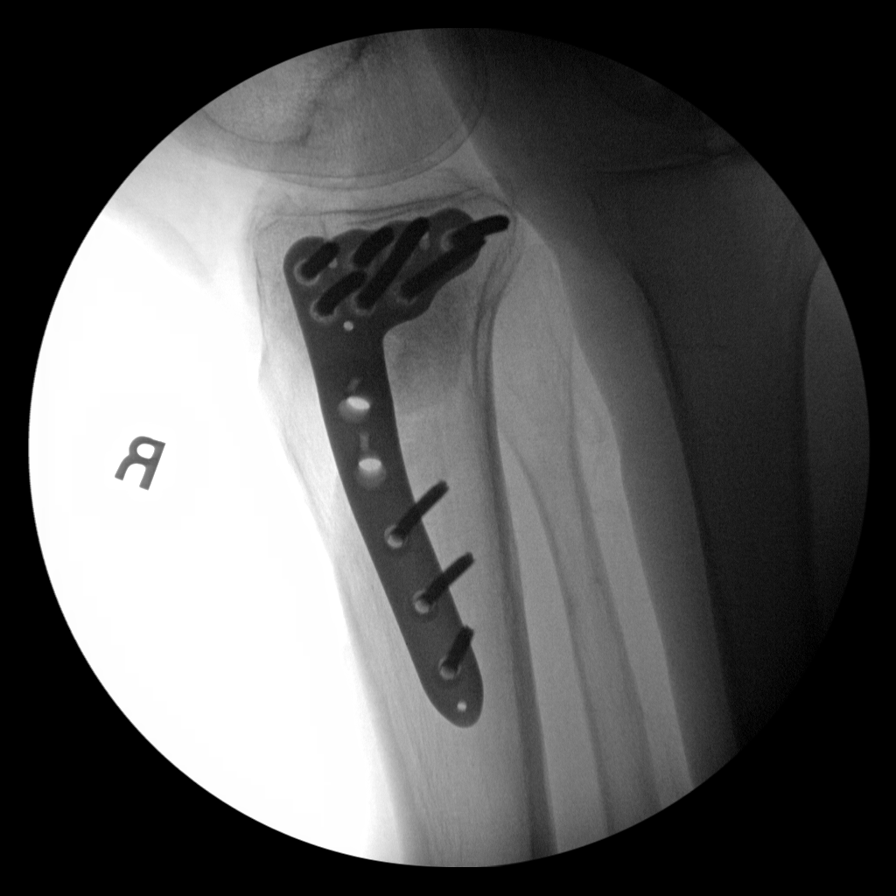

[7 of 7 positions shown; findings below may reference images not displayed]

FINDINGS: The patient has undergone plate screw fixation of the proximal
tibia. The hardware appears intact. There are expected postsurgical
changes.
IMPRESSION: Expected postsurgical changes of ORIF of the proximal right tibia.
No evidence of hardware complication.

## 2022-05-02 IMAGING — DX DG KNEE 1-2V PORT*R*
2 series · 2 of 2 positions shown · non-contrast
Comparison: None.

CLINICAL DATA: Postop.

EXAM:
PORTABLE RIGHT KNEE - 1-2 VIEW

[knee ap]
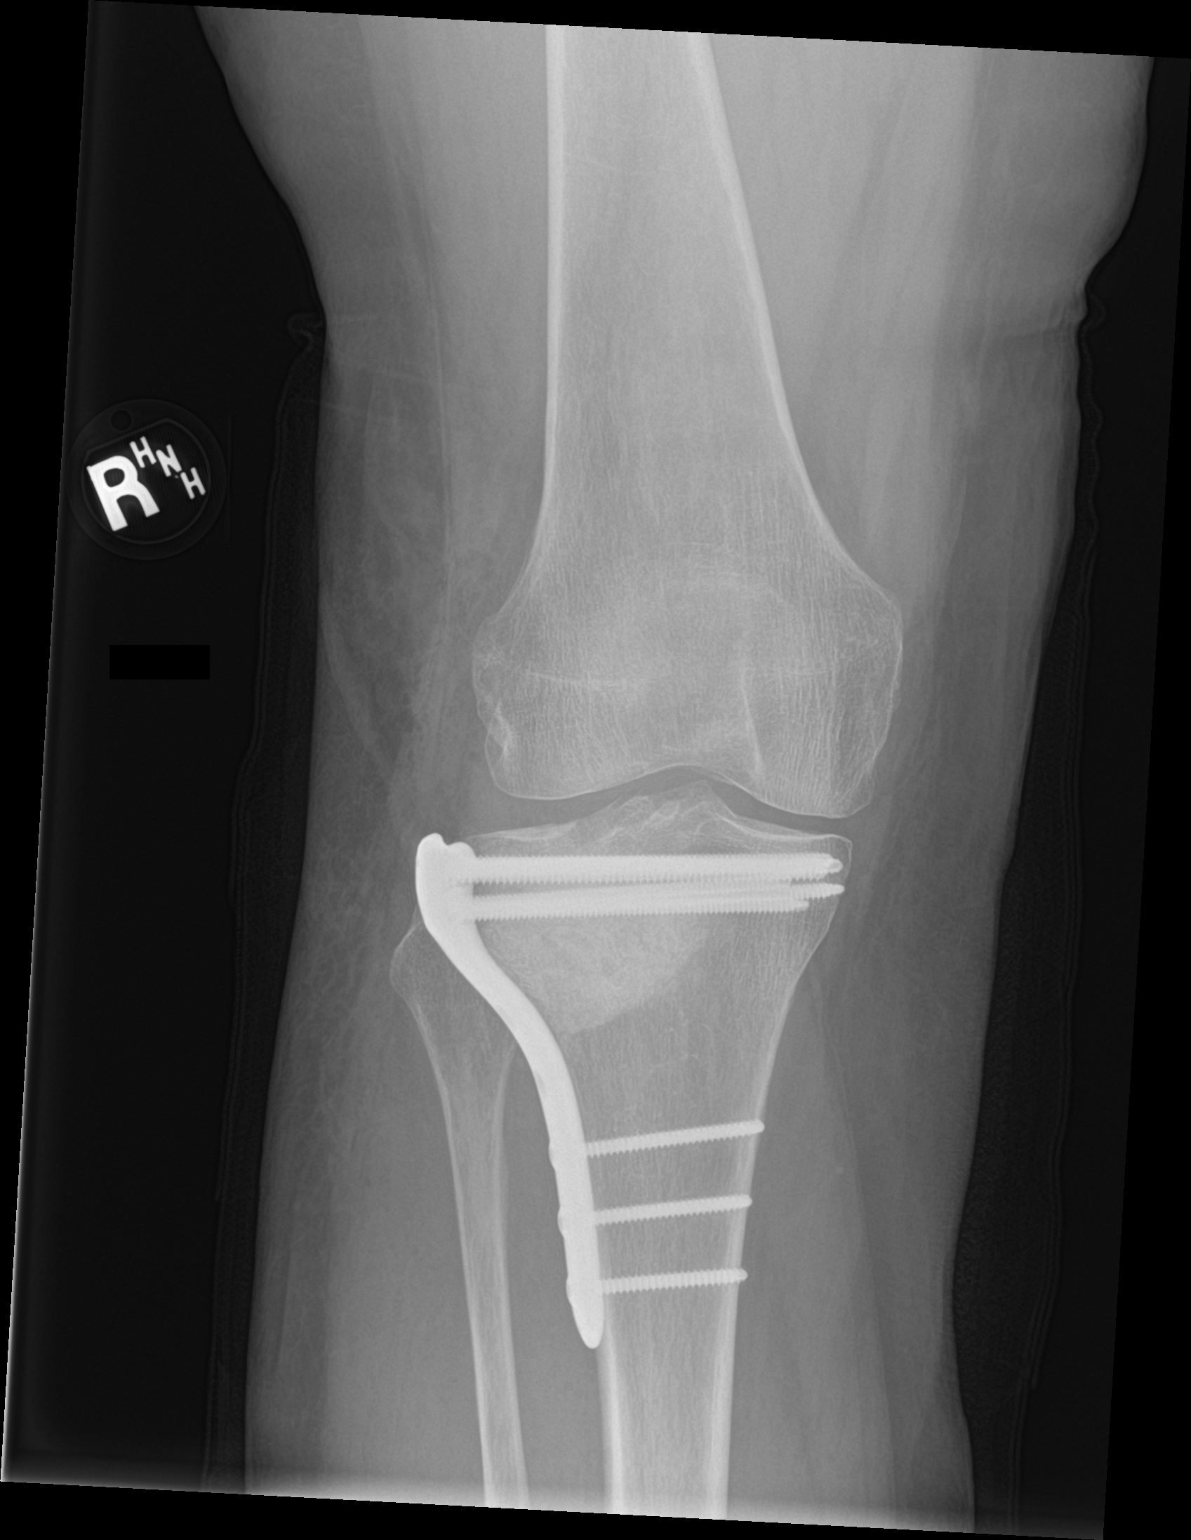

[knee lat]
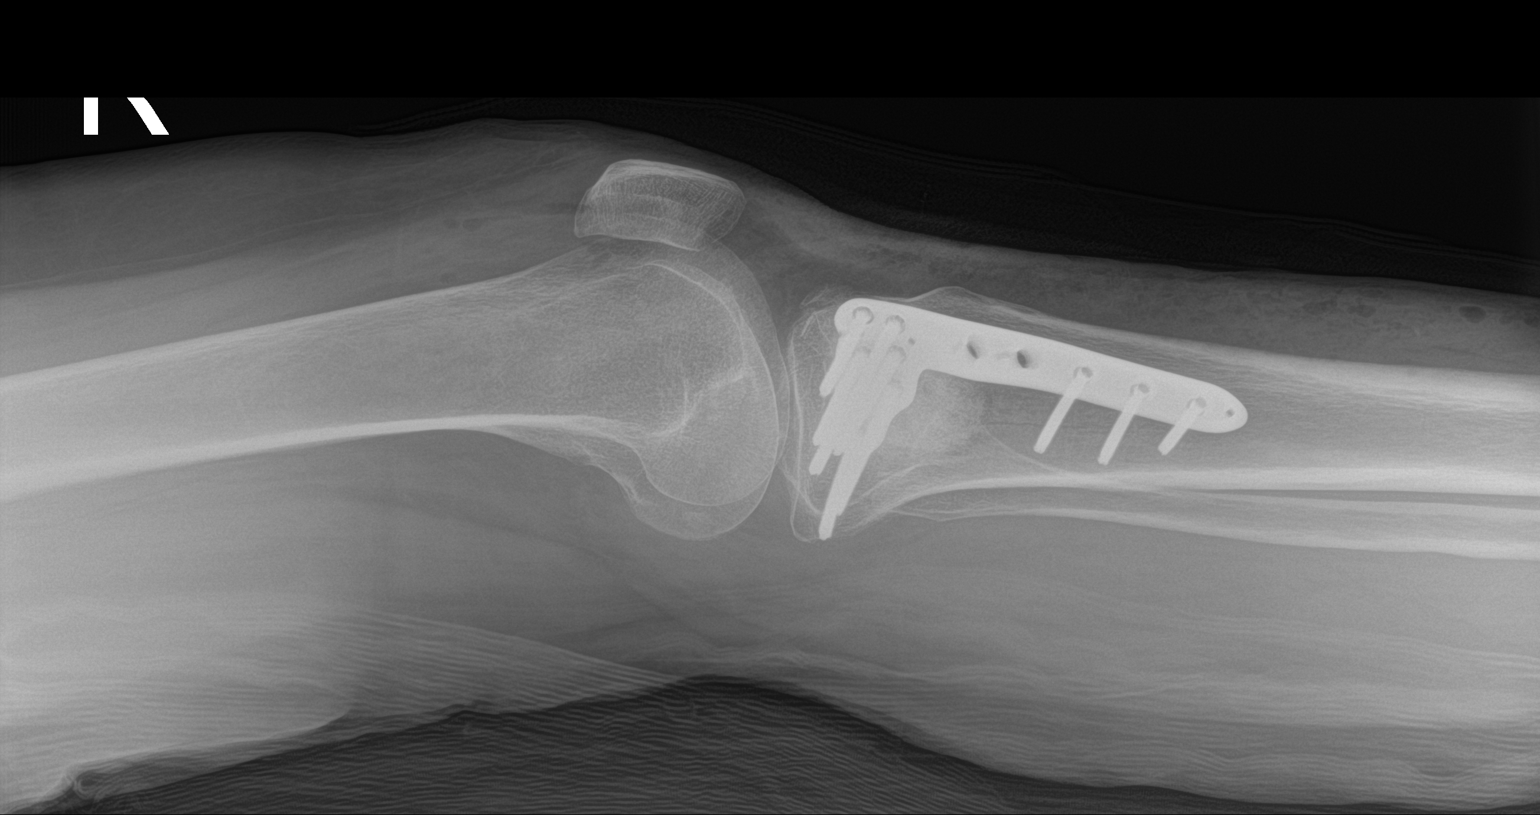

[2 of 2 positions shown; findings below may reference images not displayed]

FINDINGS: The patient has undergone plate screw fixation of the proximal
tibia. The hardware appears intact. There are expected postsurgical
changes.
IMPRESSION: Status post ORIF of the proximal tibia. No evidence of hardware
complication.

## 2022-05-28 ENCOUNTER — Other Ambulatory Visit: Payer: Self-pay | Admitting: Orthopedic Surgery

## 2022-05-28 ENCOUNTER — Ambulatory Visit
Admission: RE | Admit: 2022-05-28 | Discharge: 2022-05-28 | Disposition: A | Discharge status: Home / Self Care | Payer: Medicare Other | Source: Ambulatory Visit | Attending: Orthopedic Surgery | Admitting: Orthopedic Surgery

## 2022-05-28 DIAGNOSIS — M7989 Other specified soft tissue disorders: Secondary | ICD-10-CM | POA: Diagnosis present

## 2022-05-28 DIAGNOSIS — R2241 Localized swelling, mass and lump, right lower limb: Secondary | ICD-10-CM

## 2022-05-28 DIAGNOSIS — M79661 Pain in right lower leg: Secondary | ICD-10-CM | POA: Diagnosis present

## 2023-10-28 IMAGING — US US EXTREM LOW VENOUS*R*
1 series · 14 of 24 positions shown · non-contrast
Comparison: None Available.

CLINICAL DATA: Right lower extremity pain and swelling for 3 days

EXAM:
RIGHT LOWER EXTREMITY VENOUS DOPPLER ULTRASOUND
TECHNIQUE: Gray-scale sonography with compression, as well as color and duplex
ultrasound, were performed to evaluate the deep venous system(s)
from the level of the common femoral vein through the popliteal and
proximal calf veins.

[Series 1: us venous img lower uni right (dvt) · portal-venous · 14 of 32 slices shown]
[im 1/32]
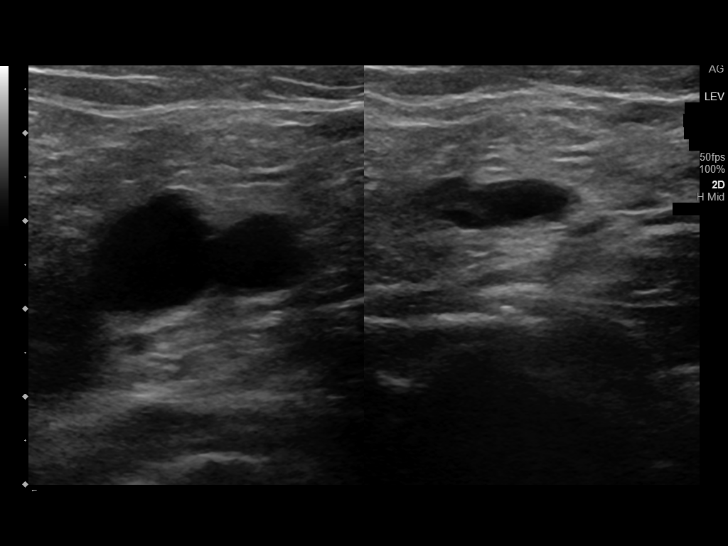
[im 3/32]
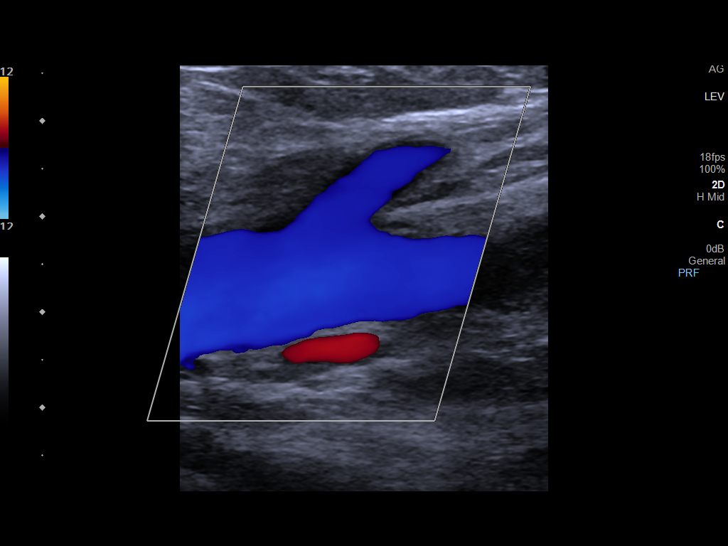
[im 6/32]
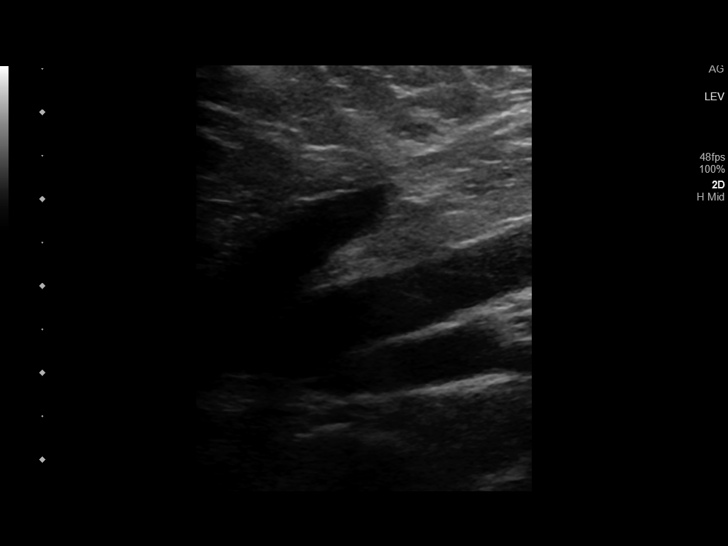
[im 9/32]
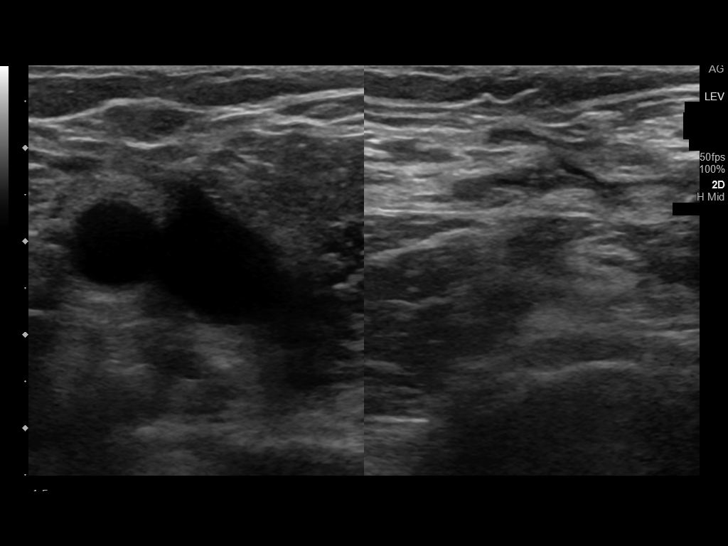
[im 10/32]
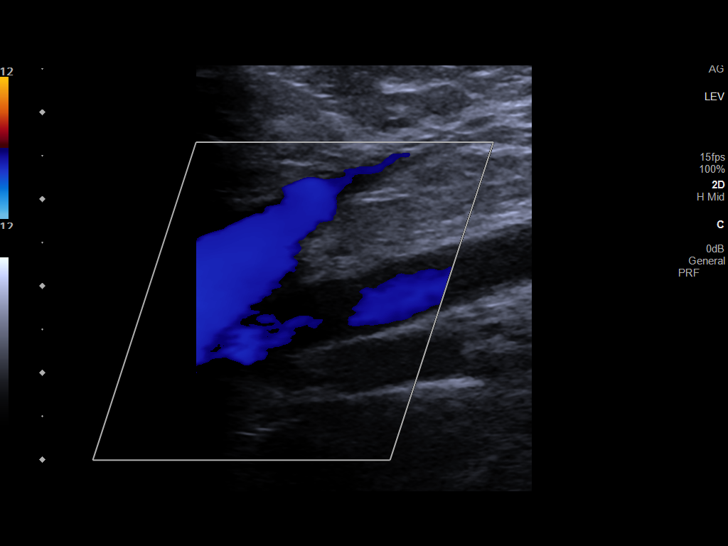
[im 13/32]
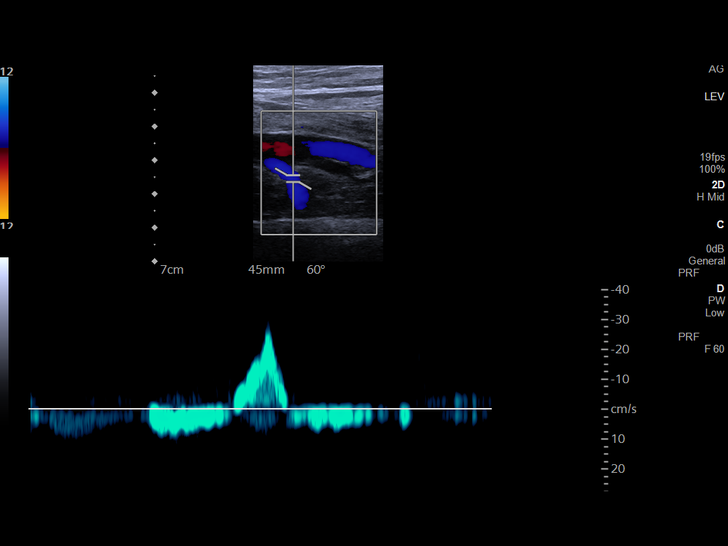
[im 15/32]
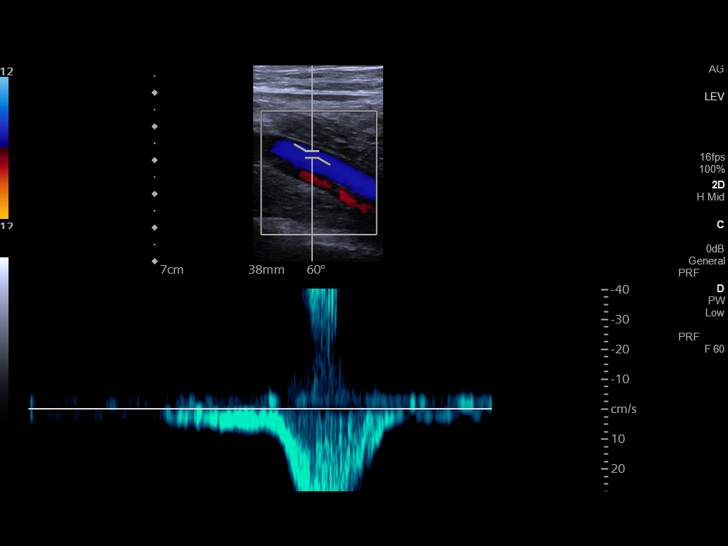
[im 17/32]
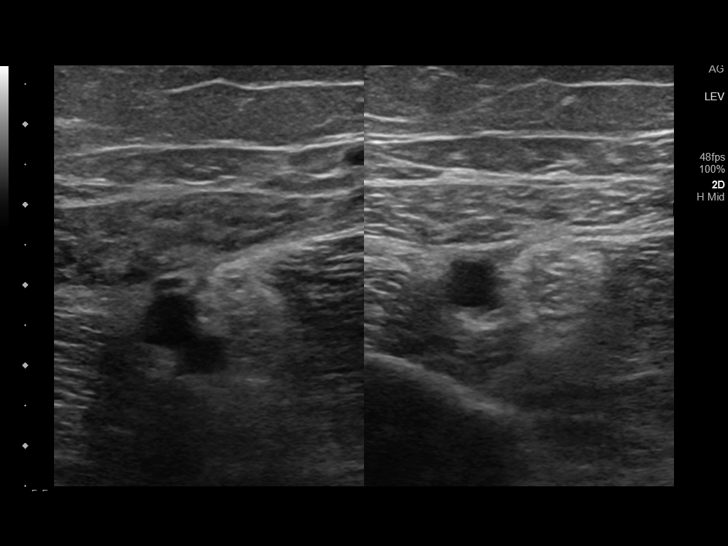
[im 19/32]
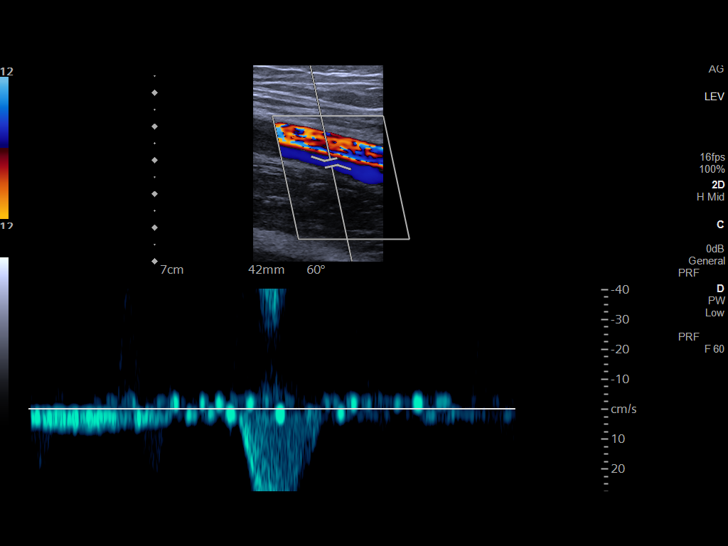
[im 22/32]
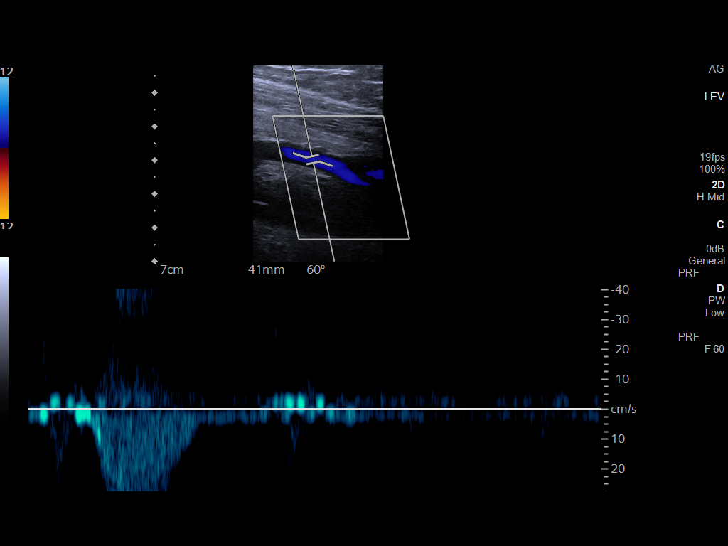
[im 25/32]
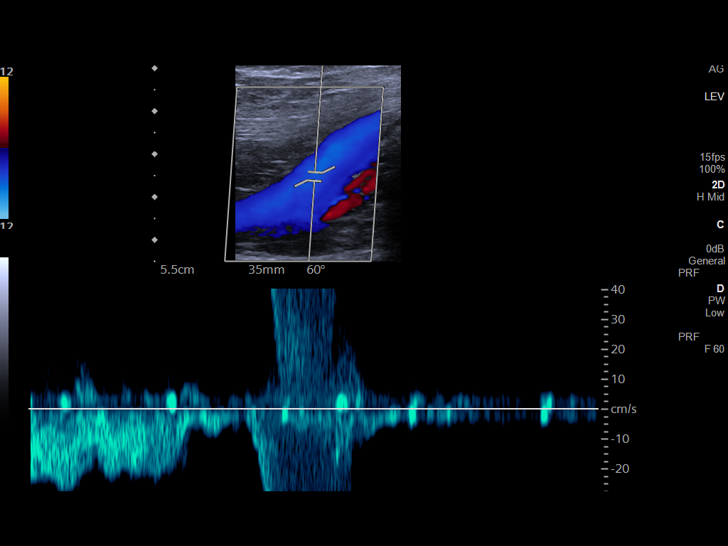
[im 26/32]
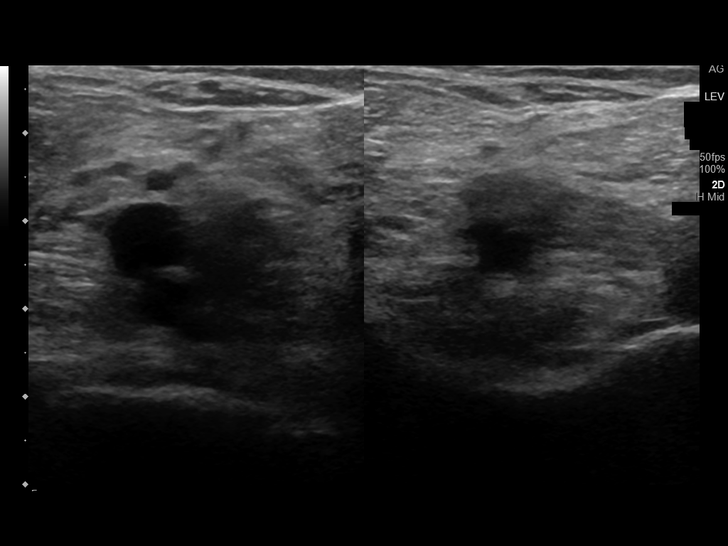
[im 29/32]
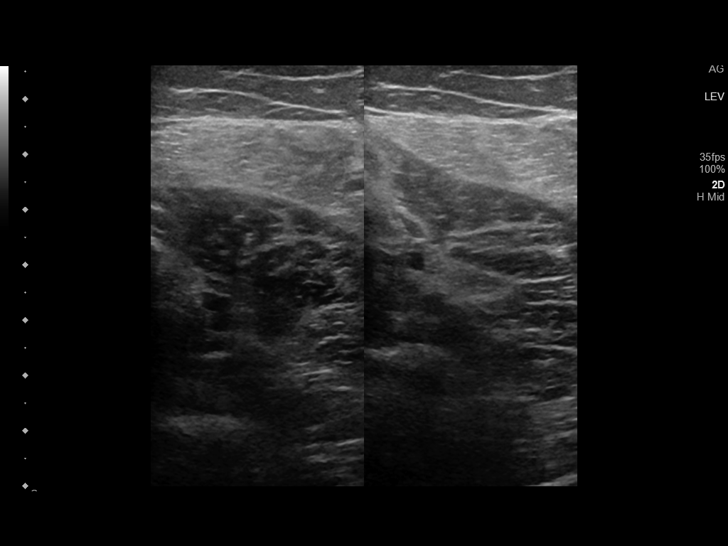
[im 32/32]
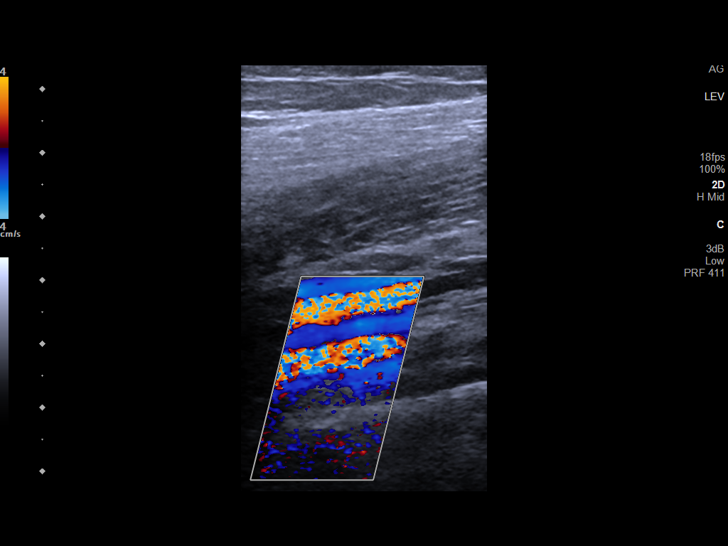

[14 of 24 positions shown; findings below may reference images not displayed]

FINDINGS: VENOUS

Normal compressibility of the common femoral, superficial femoral,
and popliteal veins, as well as the visualized calf veins.
Visualized portions of profunda femoral vein and great saphenous
vein unremarkable. No filling defects to suggest DVT on grayscale or
color Doppler imaging. Doppler waveforms show normal direction of
venous flow, normal respiratory plasticity and response to
augmentation.

Limited views of the contralateral common femoral vein are
unremarkable.

OTHER

None.

Limitations: none
IMPRESSION: Negative.

## 2023-12-08 ENCOUNTER — Ambulatory Visit: Payer: Medicare Other

## 2023-12-08 DIAGNOSIS — K640 First degree hemorrhoids: Secondary | ICD-10-CM | POA: Diagnosis not present

## 2023-12-08 DIAGNOSIS — Z09 Encounter for follow-up examination after completed treatment for conditions other than malignant neoplasm: Secondary | ICD-10-CM

## 2023-12-08 DIAGNOSIS — Z860101 Personal history of adenomatous and serrated colon polyps: Secondary | ICD-10-CM

## 2024-10-09 ENCOUNTER — Telehealth: Admitting: Physician Assistant

## 2024-10-09 DIAGNOSIS — J208 Acute bronchitis due to other specified organisms: Secondary | ICD-10-CM

## 2024-10-09 DIAGNOSIS — B9689 Other specified bacterial agents as the cause of diseases classified elsewhere: Secondary | ICD-10-CM

## 2024-10-09 MED ORDER — BENZONATATE 100 MG PO CAPS: 100.0000 mg | ORAL_CAPSULE | Freq: Three times a day (TID) | ORAL | 0 refills | Status: AC | PRN

## 2024-10-09 MED ORDER — DOXYCYCLINE HYCLATE 100 MG PO TABS: 100.0000 mg | ORAL_TABLET | Freq: Two times a day (BID) | ORAL | 0 refills | Status: AC

## 2024-10-09 NOTE — Progress Notes (Signed)
 We are sorry that you are not feeling well.  Here is how we plan to help!  Based on your presentation I believe you most likely have A cough due to bacteria.  When patients have a fever and a productive cough with a change in color or increased sputum production, we are concerned about bacterial bronchitis.  If left untreated it can progress to pneumonia.  If your symptoms do not improve with your treatment plan it is important that you contact your provider.   I have prescribed Doxycycline  100 mg twice a day for 7 days     In addition you may use A prescription cough medication called Tessalon  Perles 100mg . You may take 1-2 capsules every 8 hours as needed for your cough.   From your responses in the eVisit questionnaire you describe inflammation in the upper respiratory tract which is causing a significant cough.  This is commonly called Bronchitis and has four common causes:   Allergies Viral Infections Acid Reflux Bacterial Infection Allergies, viruses and acid reflux are treated by controlling symptoms or eliminating the cause. An example might be a cough caused by taking certain blood pressure medications. You stop the cough by changing the medication. Another example might be a cough caused by acid reflux. Controlling the reflux helps control the cough.  USE OF BRONCHODILATOR (RESCUE) INHALERS: There is a risk from using your bronchodilator too frequently.  The risk is that over-reliance on a medication which only relaxes the muscles surrounding the breathing tubes can reduce the effectiveness of medications prescribed to reduce swelling and congestion of the tubes themselves.  Although you feel brief relief from the bronchodilator inhaler, your asthma may actually be worsening with the tubes becoming more swollen and filled with mucus.  This can delay other crucial treatments, such as oral steroid medications. If you need to use a bronchodilator inhaler daily, several times per day, you  should discuss this with your provider.  There are probably better treatments that could be used to keep your asthma under control.     HOME CARE Only take medications as instructed by your medical team. Complete the entire course of an antibiotic. Drink plenty of fluids and get plenty of rest. Avoid close contacts especially the very young and the elderly Cover your mouth if you cough or cough into your sleeve. Always remember to wash your hands A steam or ultrasonic humidifier can help congestion.   GET HELP RIGHT AWAY IF: You develop worsening fever. You become short of breath You cough up blood. Your symptoms persist after you have completed your treatment plan MAKE SURE YOU  Understand these instructions. Will watch your condition. Will get help right away if you are not doing well or get worse.  Your e-visit answers were reviewed by a board certified advanced clinical practitioner to complete your personal care plan.  Depending on the condition, your plan could have included both over the counter or prescription medications. If there is a problem please reply  once you have received a response from your provider. Your safety is important to us .  If you have drug allergies check your prescription carefully.    You can use MyChart to ask questions about today's visit, request a non-urgent call back, or ask for a work or school excuse for 24 hours related to this e-Visit. If it has been greater than 24 hours you will need to follow up with your provider, or enter a new e-Visit to address those concerns. You  will get an e-mail in the next two days asking about your experience.  I hope that your e-visit has been valuable and will speed your recovery. Thank you for using e-visits.   I have spent 5 minutes in review of e-visit questionnaire, review and updating patient chart, medical decision making and response to patient.   Elsie Velma Lunger, PA-C
